# Patient Record
Sex: Female | Born: 1955 | Race: White | Hispanic: No | Smoking: Never smoker
Health system: Southern US, Academic
[De-identification: ages and names within clinical notes are randomized; demographics above are authoritative.]

## PROBLEM LIST (undated history)

## (undated) DIAGNOSIS — K589 Irritable bowel syndrome without diarrhea: Secondary | ICD-10-CM

## (undated) DIAGNOSIS — R609 Edema, unspecified: Secondary | ICD-10-CM

## (undated) DIAGNOSIS — I10 Essential (primary) hypertension: Secondary | ICD-10-CM

## (undated) DIAGNOSIS — R002 Palpitations: Secondary | ICD-10-CM

## (undated) DIAGNOSIS — K219 Gastro-esophageal reflux disease without esophagitis: Secondary | ICD-10-CM

## (undated) DIAGNOSIS — R0600 Dyspnea, unspecified: Secondary | ICD-10-CM

## (undated) DIAGNOSIS — I493 Ventricular premature depolarization: Secondary | ICD-10-CM

## (undated) DIAGNOSIS — J45909 Unspecified asthma, uncomplicated: Secondary | ICD-10-CM

## (undated) DIAGNOSIS — Z85828 Personal history of other malignant neoplasm of skin: Secondary | ICD-10-CM

## (undated) HISTORY — PX: BASAL CELL CARCINOMA EXCISION: SHX1214

## (undated) HISTORY — PX: HX APPENDECTOMY: SHX54

## (undated) HISTORY — DX: Irritable bowel syndrome, unspecified: K58.9

## (undated) HISTORY — DX: Essential (primary) hypertension: I10

## (undated) HISTORY — DX: Gastro-esophageal reflux disease without esophagitis: K21.9

## (undated) HISTORY — DX: Edema, unspecified: R60.9

## (undated) HISTORY — DX: Personal history of other malignant neoplasm of skin: Z85.828

## (undated) HISTORY — PX: HX COLONOSCOPY: 2100001147

## (undated) HISTORY — DX: Dyspnea, unspecified: R06.00

## (undated) HISTORY — DX: Ventricular premature depolarization: I49.3

## (undated) HISTORY — DX: Unspecified asthma, uncomplicated: J45.909

## (undated) HISTORY — PX: HX GALL BLADDER SURGERY/CHOLE: SHX55

## (undated) HISTORY — DX: Palpitations: R00.2

---

## 1993-05-25 ENCOUNTER — Other Ambulatory Visit (HOSPITAL_COMMUNITY): Payer: Self-pay | Admitting: EXTERNAL

## 2018-07-08 HISTORY — PX: CARDIAC CATHETERIZATION: SHX172

## 2020-10-06 IMAGING — CR XRAY THORACIC SPINE 2 VIEWS
1 series · 3 of 3 positions shown · non-contrast
Comparison: None previous.

﻿EXAM:  [DATE]      XRAY THORACIC SPINE 2 VIEWS,XRAY LUMBAR SPINE MINIMUM 4 VIEWS
INDICATION: 64-year-old with upper and low back pain.  No history of trauma.

[Series 1: view not recorded · 0.17mm/px · 3 of 3 slices shown]
[im 1/3]
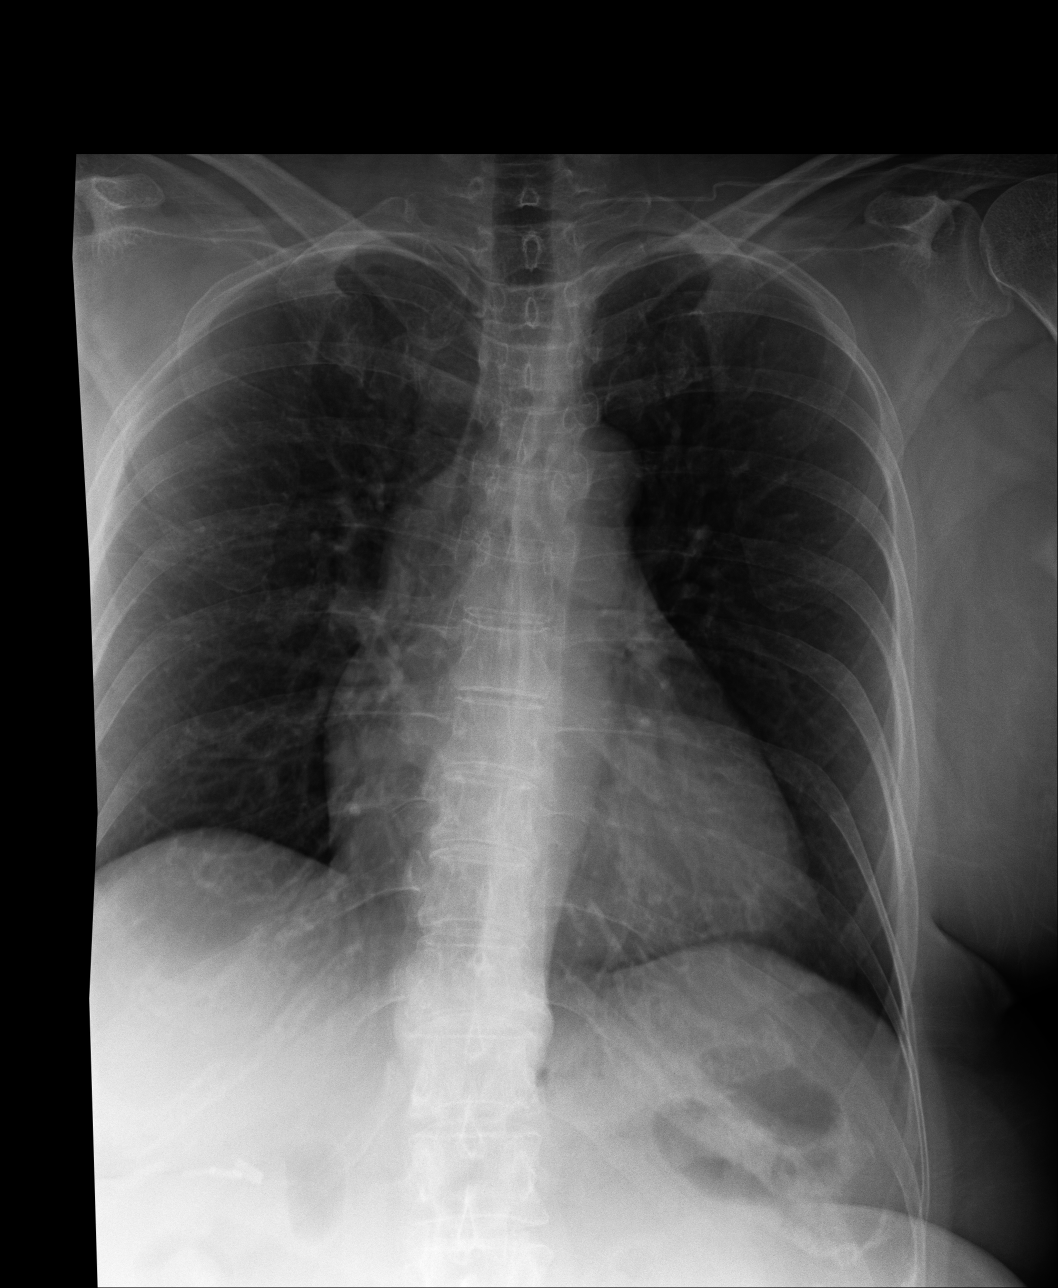
[im 2/3]
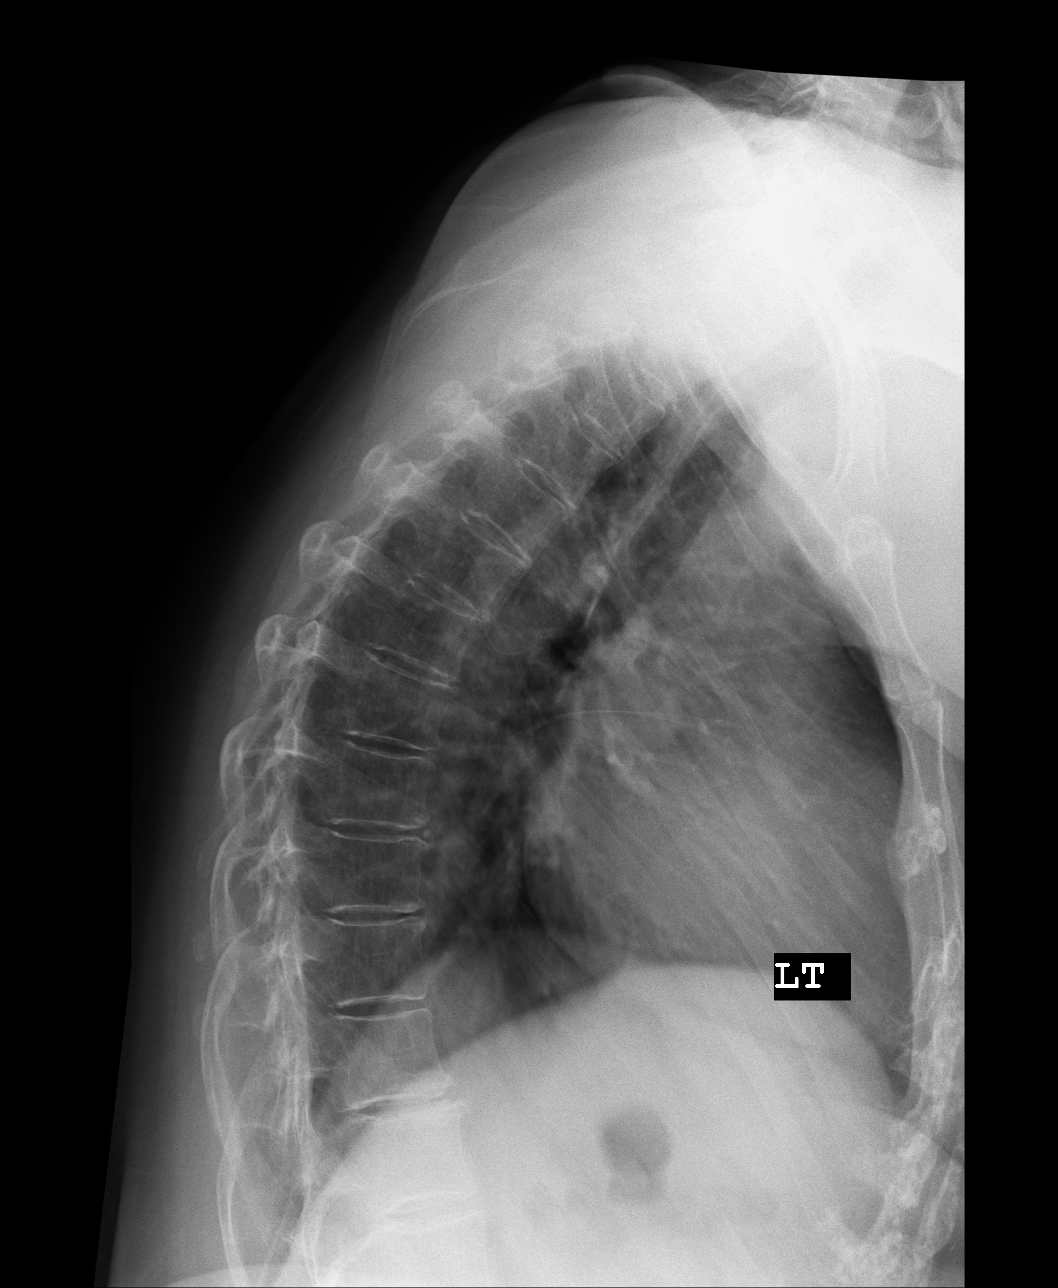
[im 3/3]
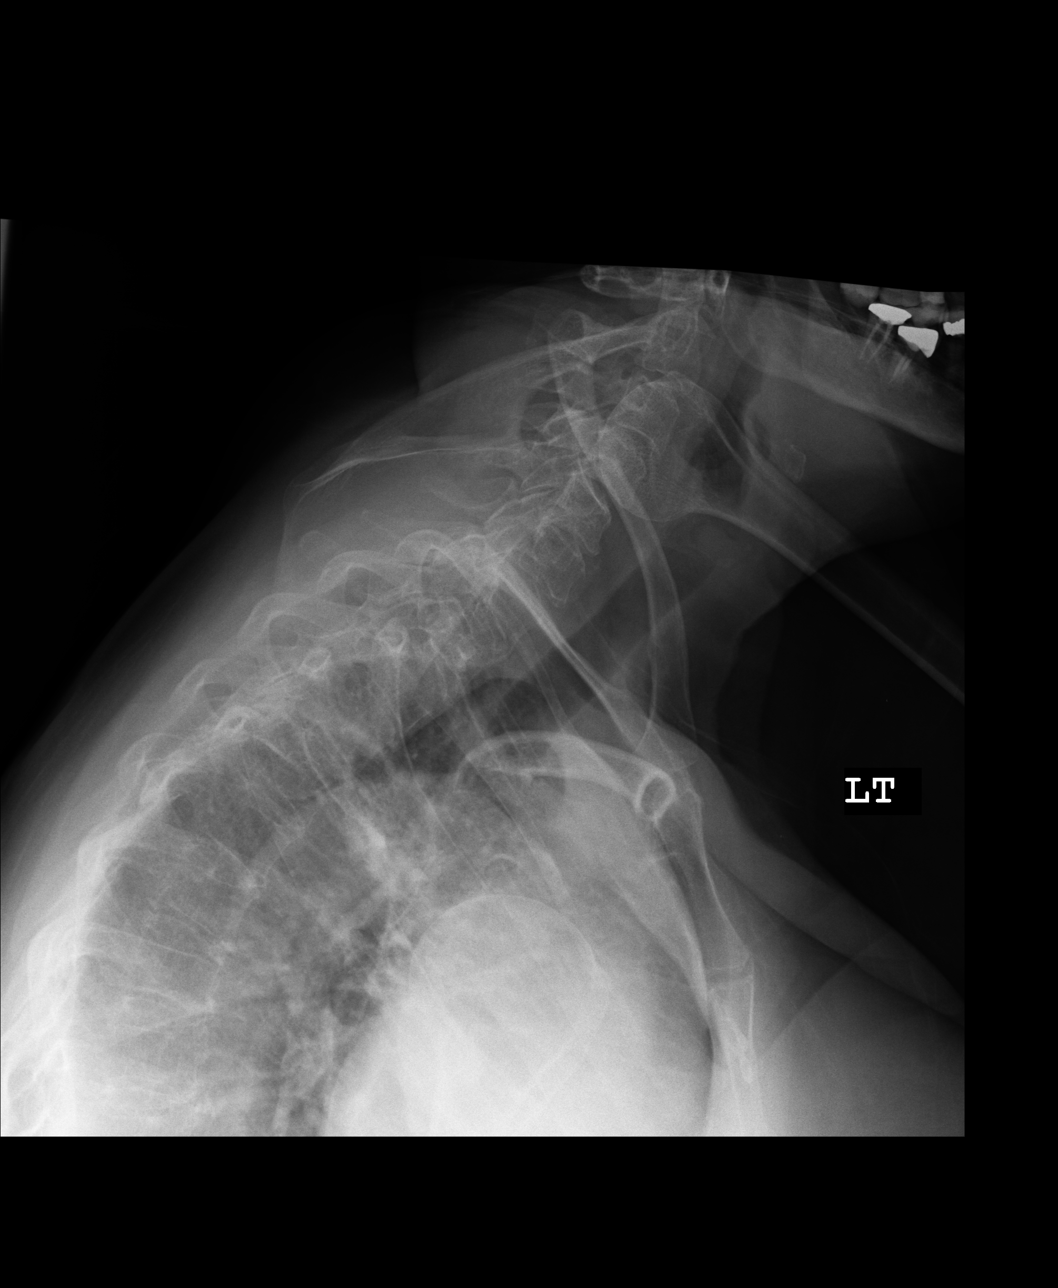

[3 of 3 positions shown; findings below may reference images not displayed]

FINDINGS: Osteopenic bones are suspected.  Severe degenerative disc changes and facet arthropathy are noted at L5-S1 level with lesser changes in the mid lumbar spine. 

Significant degenerative disc changes in the lower thoracic spine at T11-T12 level noted.  Paravertebral soft tissues are unremarkable
IMPRESSION: 1. Suspected osteopenic bones.  Please correlate with DEXA densitometry.

2. Severe degenerative disc changes and facet arthropathy at L5-S1 level.  Bilateral sacroiliac arthropathy. 

3. Significant degenerative disc disease at T11-T12 level.  

4. If symptoms are persistent, further imaging with MRI is recommended.

## 2020-10-06 IMAGING — CR XRAY LUMBAR SPINE MINIMUM 4 VIEWS
1 series · 5 of 5 positions shown · non-contrast
Comparison: None previous.

﻿EXAM:  [DATE]      XRAY THORACIC SPINE 2 VIEWS,XRAY LUMBAR SPINE MINIMUM 4 VIEWS
INDICATION: 64-year-old with upper and low back pain.  No history of trauma.

[Series 1: view not recorded · 0.17mm/px · 5 of 5 slices shown]
[im 1/5]
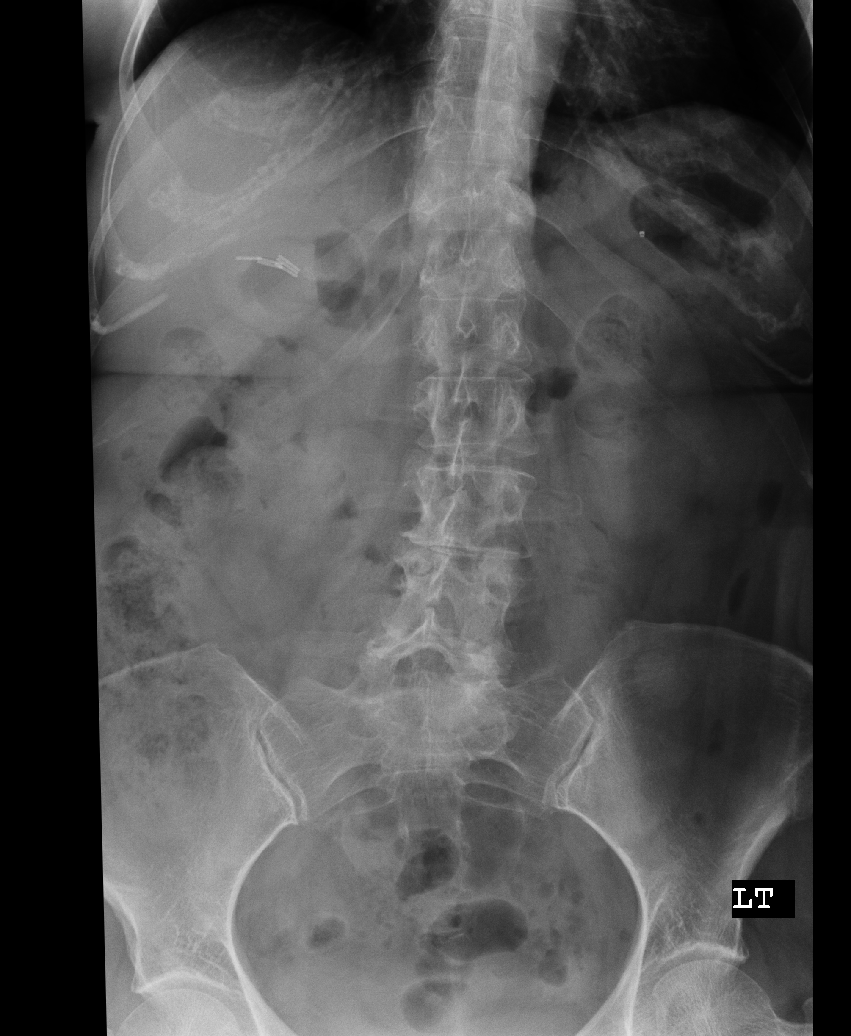
[im 2/5]
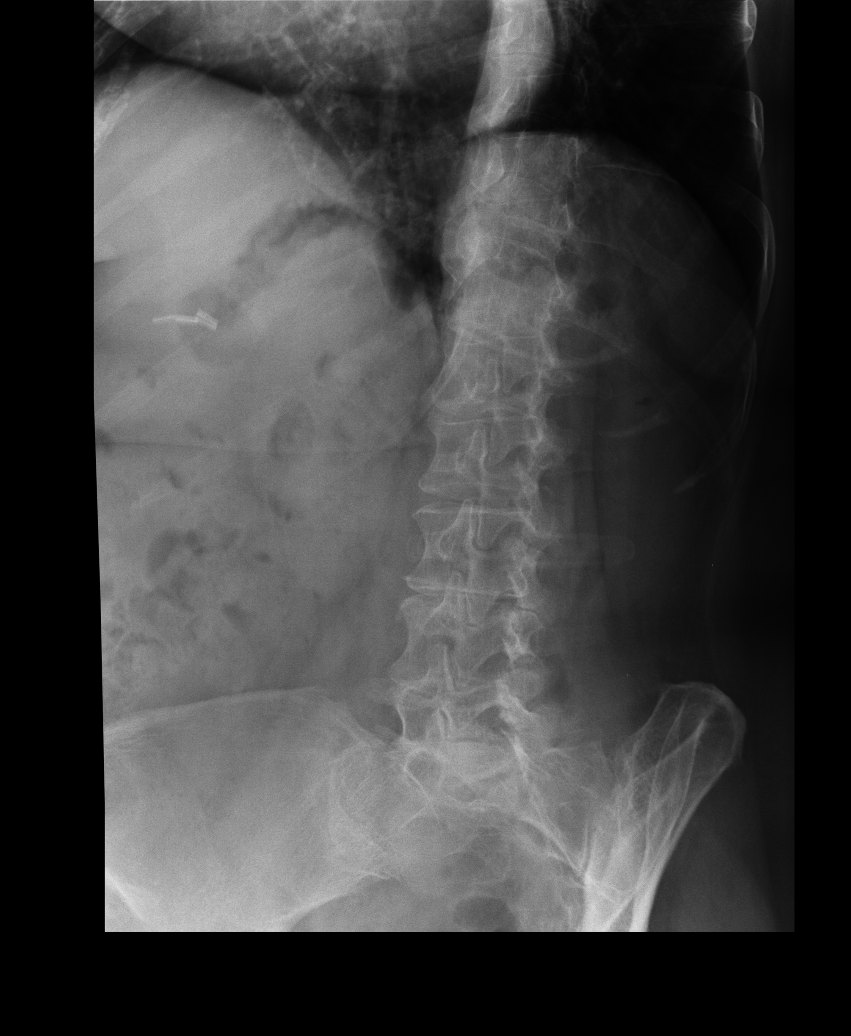
[im 3/5]
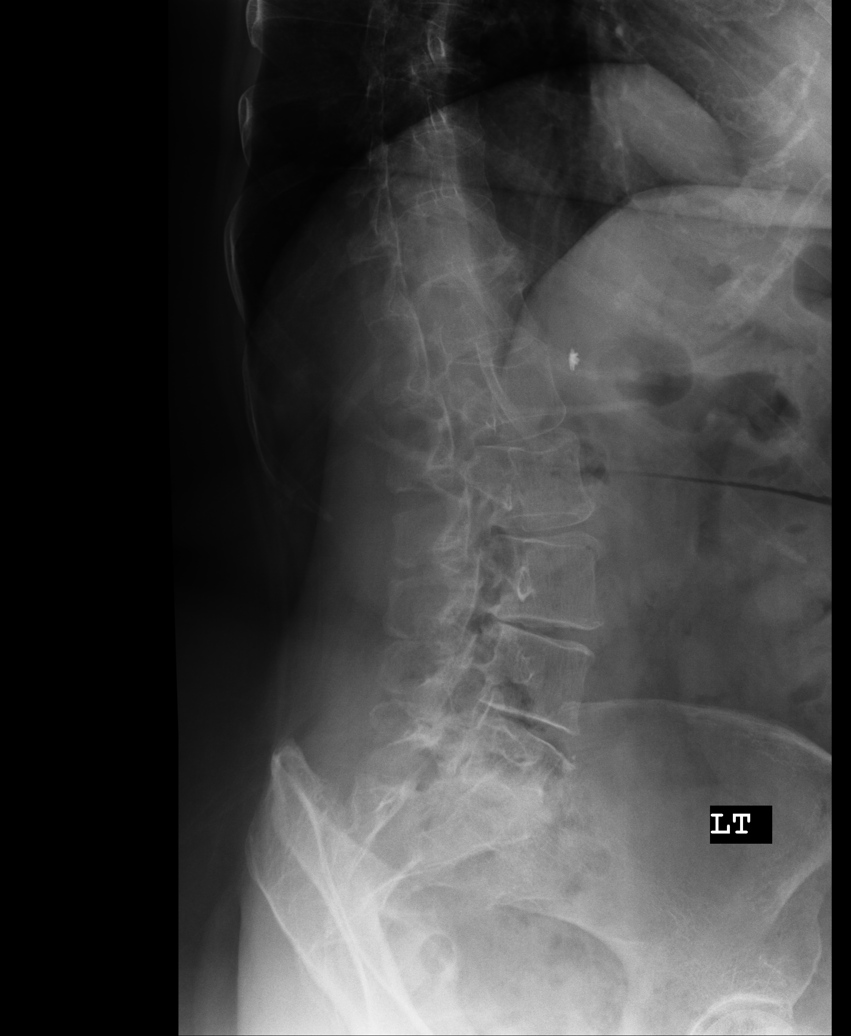
[im 4/5]
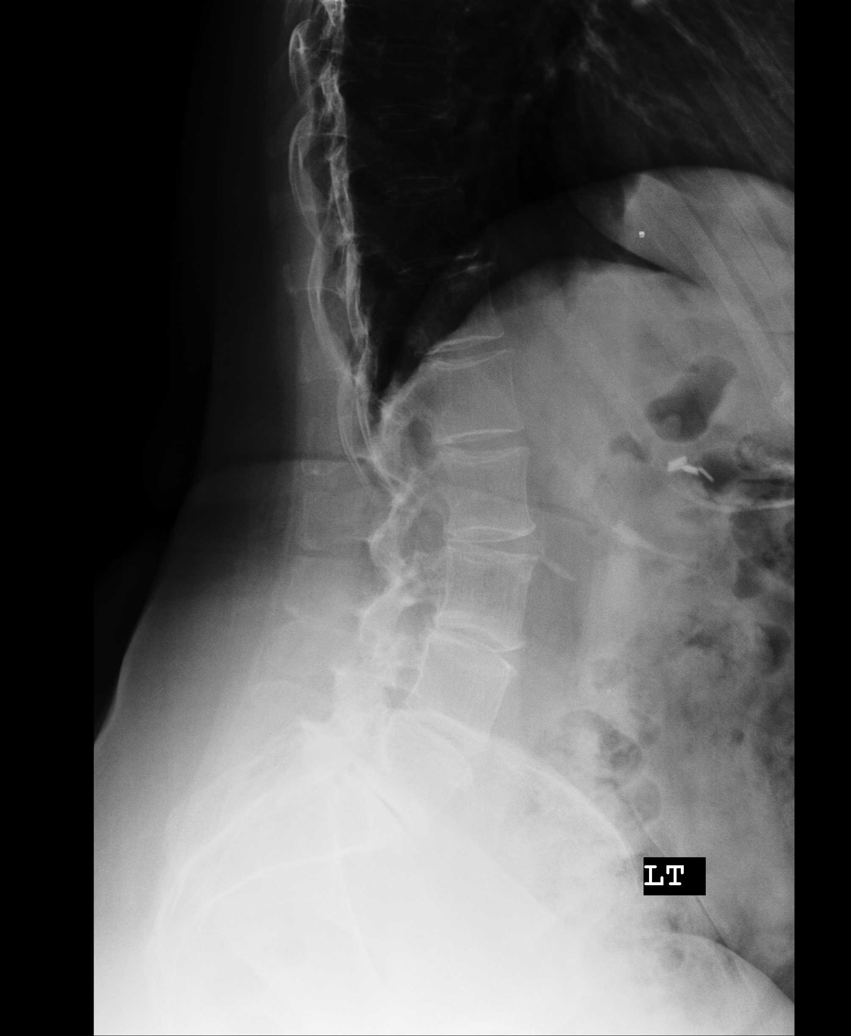
[im 5/5]
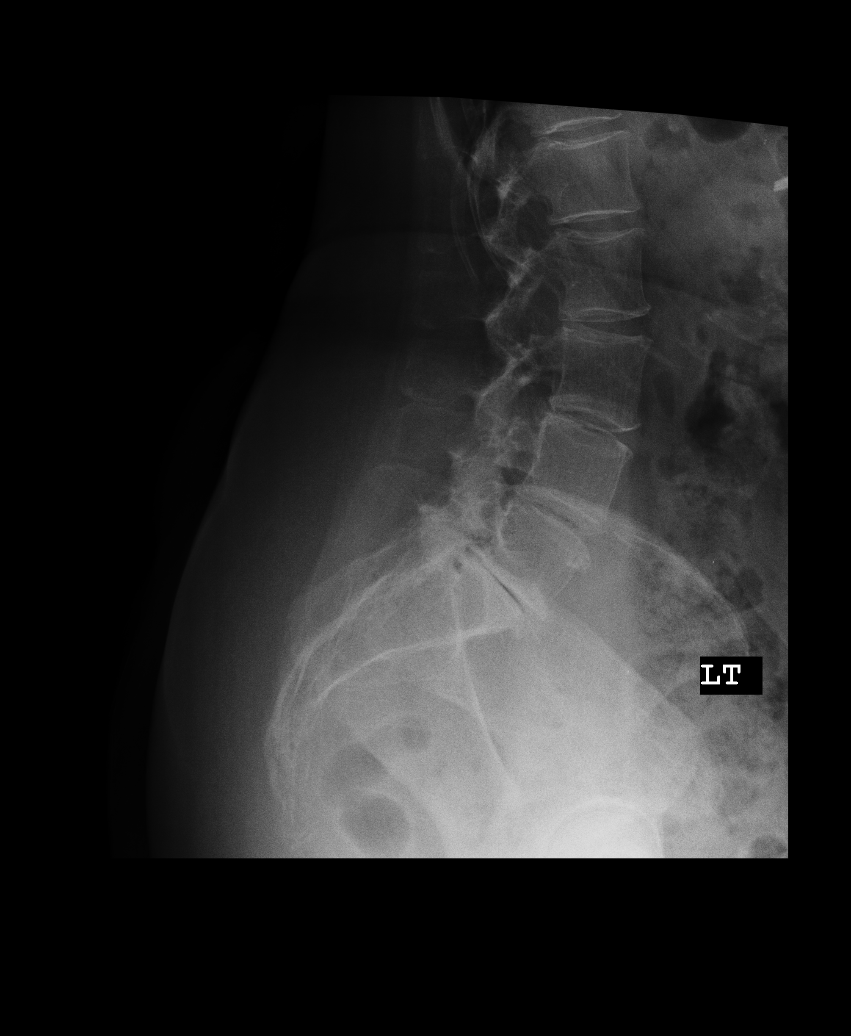

[5 of 5 positions shown; findings below may reference images not displayed]

FINDINGS: Osteopenic bones are suspected.  Severe degenerative disc changes and facet arthropathy are noted at L5-S1 level with lesser changes in the mid lumbar spine. 

Significant degenerative disc changes in the lower thoracic spine at T11-T12 level noted.  Paravertebral soft tissues are unremarkable
IMPRESSION: 1. Suspected osteopenic bones.  Please correlate with DEXA densitometry.

2. Severe degenerative disc changes and facet arthropathy at L5-S1 level.  Bilateral sacroiliac arthropathy. 

3. Significant degenerative disc disease at T11-T12 level.  

4. If symptoms are persistent, further imaging with MRI is recommended.

## 2021-04-15 IMAGING — MG 3D SCREENING MAMMO BIL W/CAD & TOMO
5 series · 8 of 24 positions shown · non-contrast
Comparison: None.  This is a baseline exam.

------------- REPORT [id number] -------------
﻿

EXAM:  3D BILATERAL ANNUAL SCREENING DIGITAL MAMMOGRAM WITH CAD AND TOMOSYNTHESIS
INDICATION: Screening.

[L]
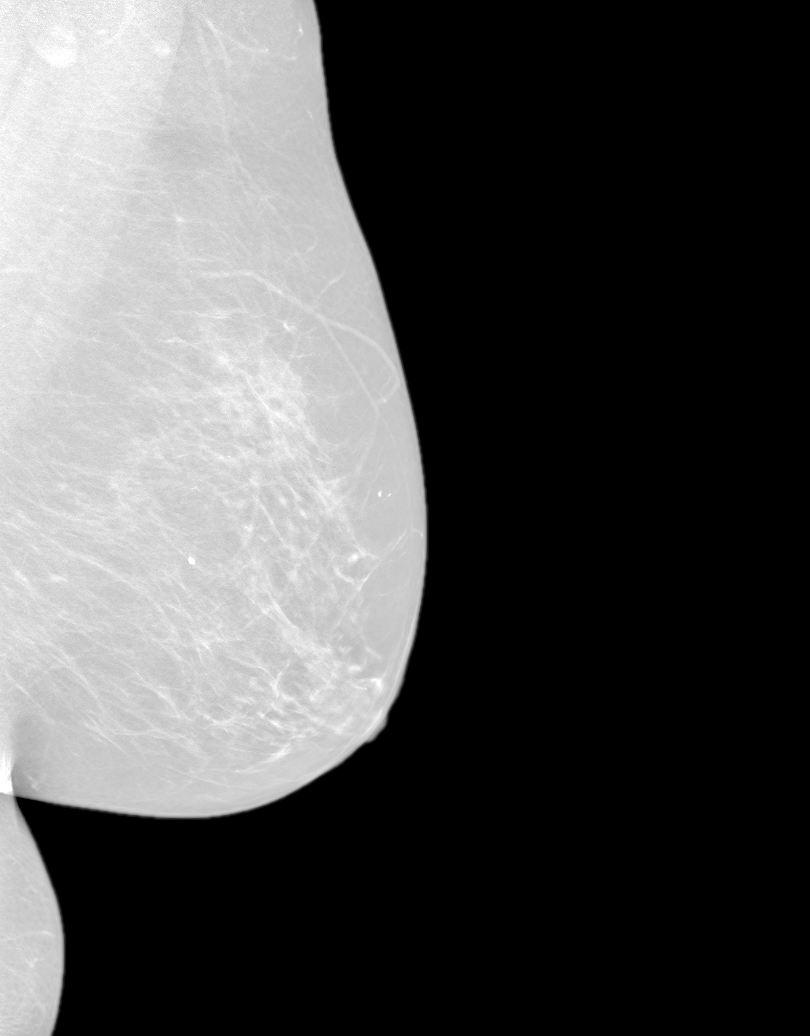

[R]
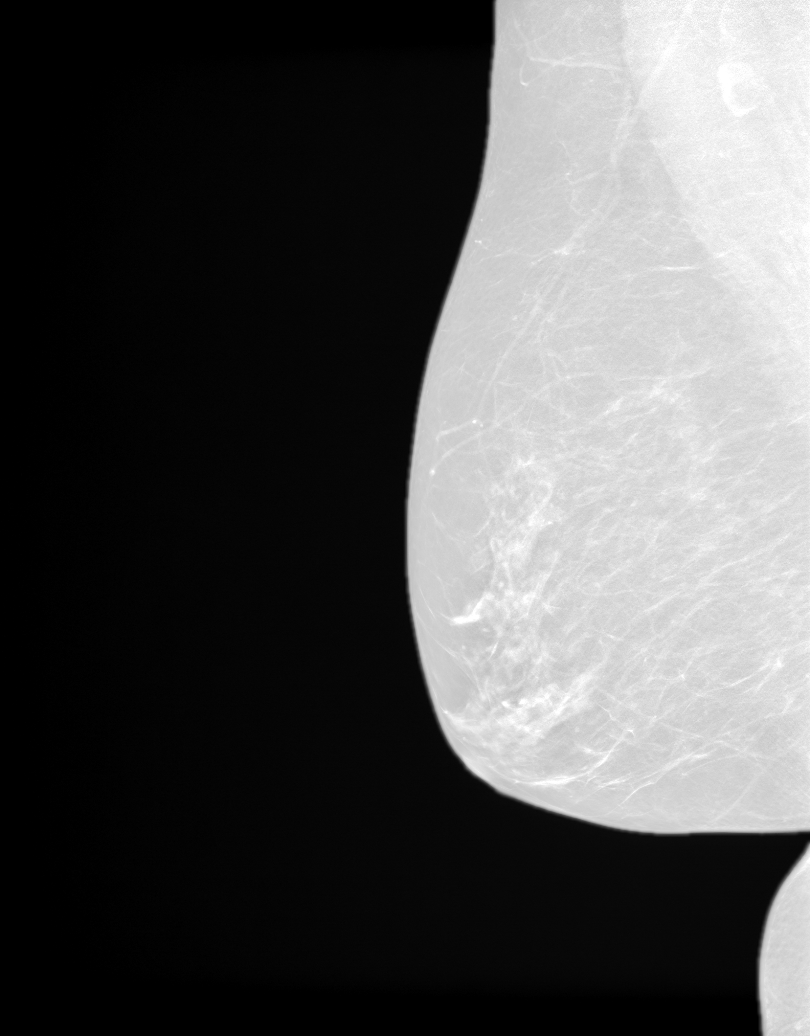

[R CC tomo · right · 0.10mm/px · 2 of 2 slices shown]
[im 1/2]
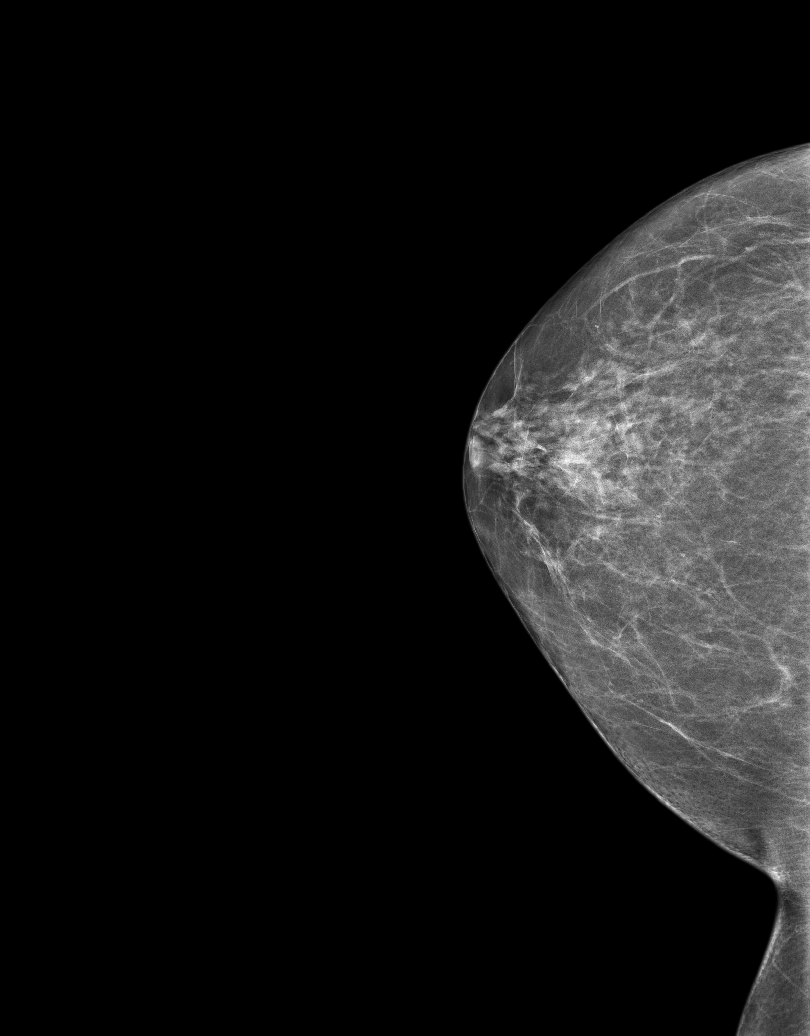
[im 2/2]
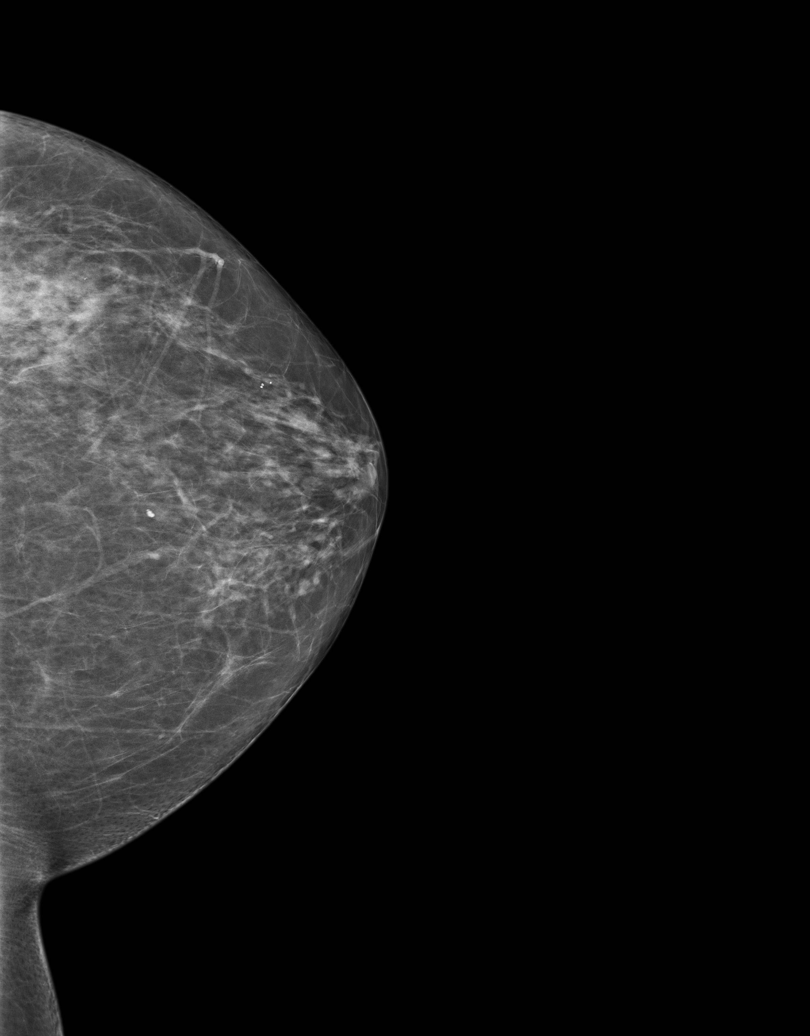

[3D SCREENING MAMMO BIL W/CAD & TOMO tomo · 2 acquisitions, 3 frames shown (1 of 2)]
[im 1/2]
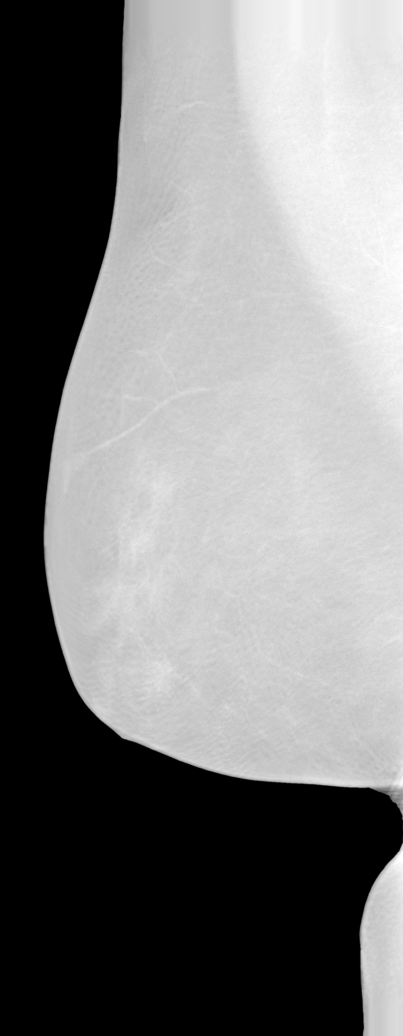
[im 2/2]
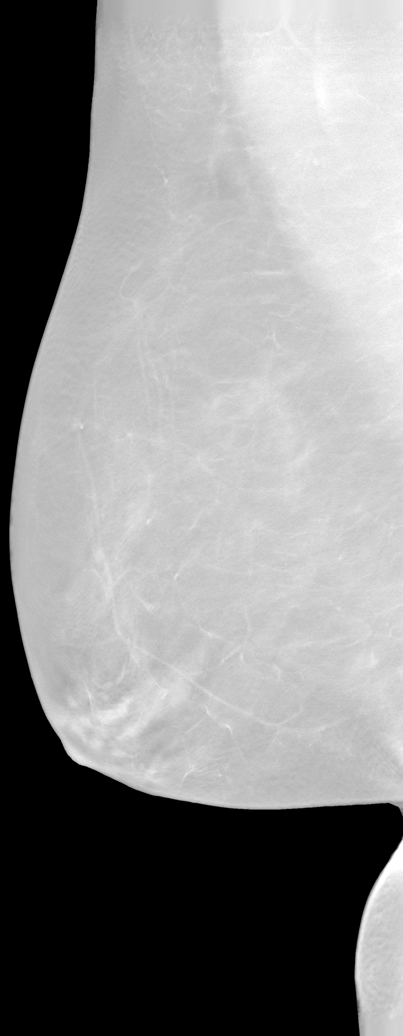
[im 2/2]
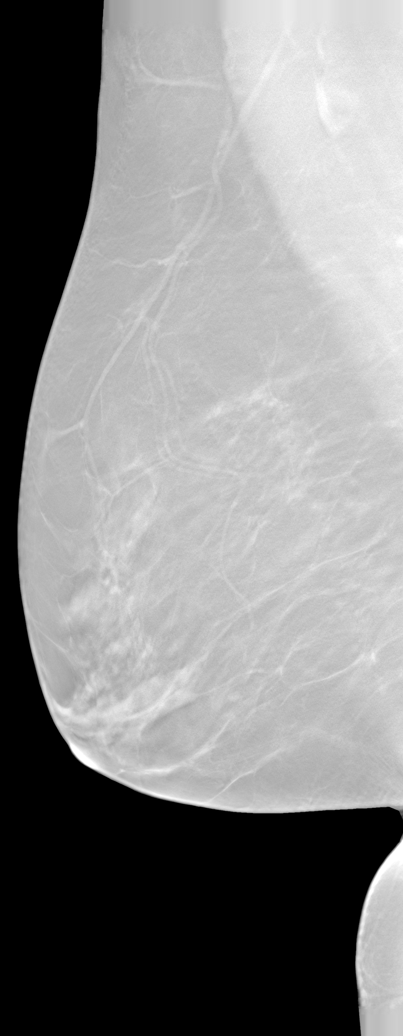

[3D SCREENING MAMMO BIL W/CAD & TOMO tomo (2 of 2) · tomo slice 13/83.0]
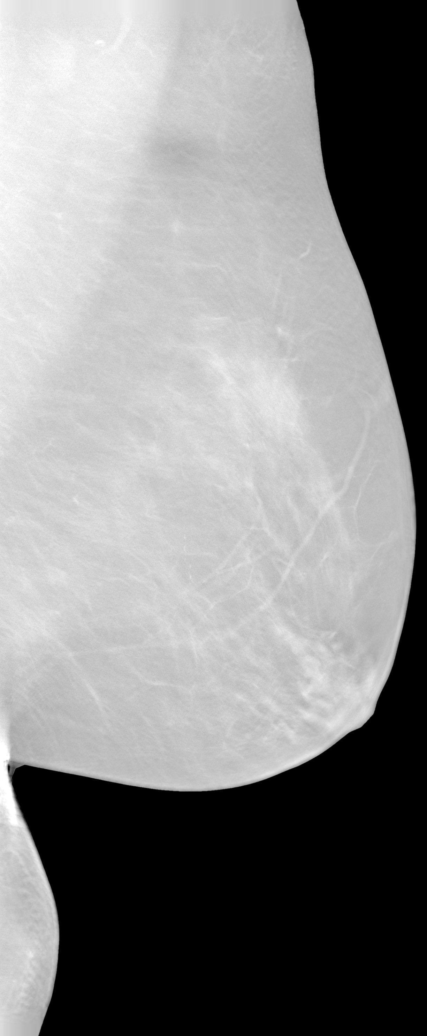

[8 of 24 positions shown; findings below may reference images not displayed]

FINDINGS: Breast parenchyma is heterogeneously dense.  There is no mass or suspicious cluster of microcalcifications.  There is no architectural distortion, skin thickening or nipple retraction.
IMPRESSION: 1.  BIRADS 0-Need additional imaging evaluation.  A comprehensive bilateral breast ultrasound is recommended due to heterogeneously dense breast parenchyma and lack of previous comparison mammograms.

2.  DENSITY CODE – C (Heterogeneously dense). 

Final Assessment Code:

Bi-Rads 0

BI-RADS 0
 Need additional imaging evaluation.

BI-RADS 1
 Negative mammogram.

BI-RADS 2
 Benign finding.

BI-RADS 3
 Probably benign finding; short-interval follow-up suggested.

BI-RADS 4
 Suspicious abnormality; biopsy should be considered.

BI-RADS 5
 Highly suggestive of malignancy; appropriate action should be taken.

BI-RADS 6
 Known biopsy-proven malignancy; appropriate action should be taken.

NOTE:
In compliance with Federal regulations, the results of this mammogram are being sent to the patient.

------------- REPORT [id number] -------------
[facility]
[address]
[place] Ms.TEII, TEIURUURU:
We wish to report the following on your recent mammography examination. We are sending a report to your referring physician or other health care provider. 
(       Abnormal: There is a finding on your mammogram that requires further tests for a more thorough evaluation. You should contact your referring physician as soon as possible (if you have not already done so).
This statement is mandated by the [place], Department of Health.
Your examination was performed by one of our technologists, who are registered radiological technologists and also specially certified in mammography:
___
Fatah, Abdalfatah (M)

Your mammogram was interpreted by our radiologist.

( 
Valentyna Groover, M.D.

(Annual Breast Examination by a physician or other health care provider
(Annual Mammography Screening beginning at age 40
(Monthly Breast Self Examination

## 2021-04-20 IMAGING — US US BIL BREAST [ID] QUADRANTS PLUS AXILLA
1 series · 14 of 25 positions shown · non-contrast
Comparison: Mammogram dated 04/15/2021.

﻿EXAM:  [id number]   US BIL BREAST [id number] QUADRANTS PLUS AXILLA
INDICATION: Call back for heterogeneously dense breast parenchyma.

[Series 1: us bil breast (id) quadrants plus axilla · 14 of 53 slices shown]
[im 1/53]
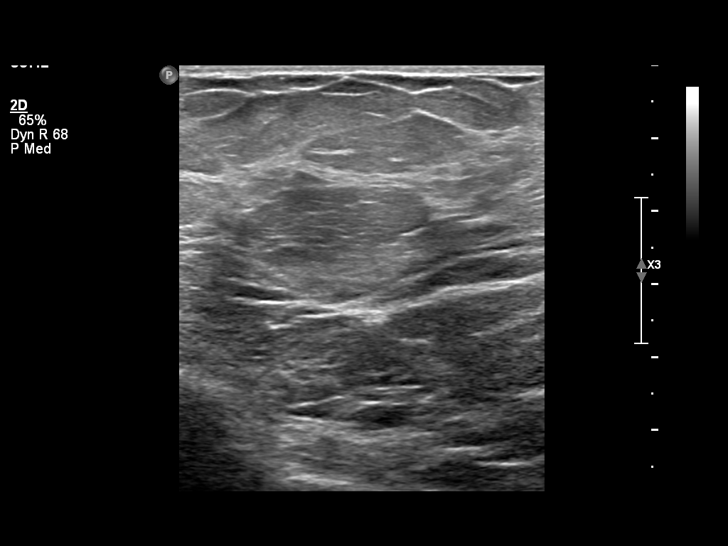
[im 5/53]
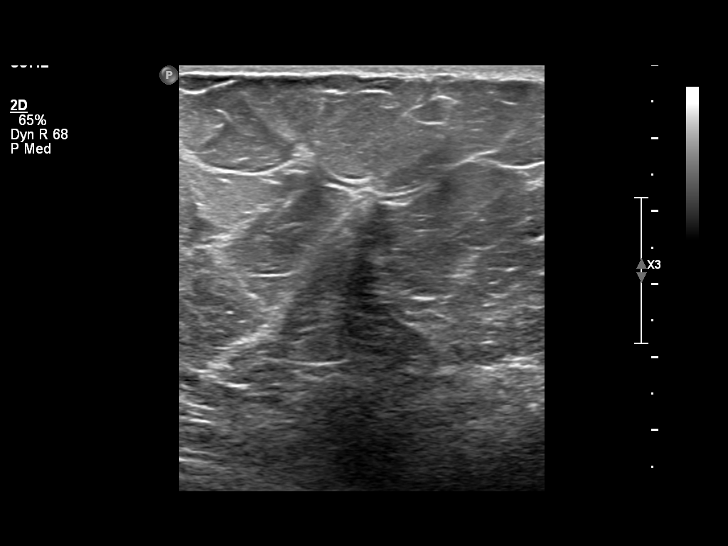
[im 9/53]
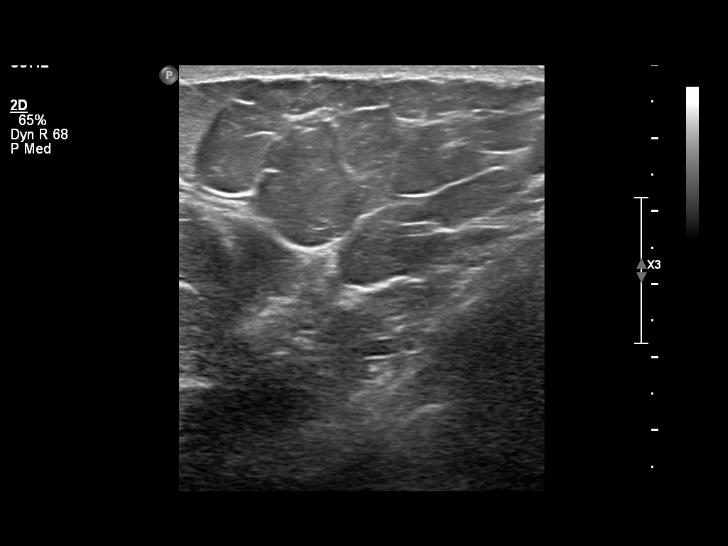
[im 14/53]
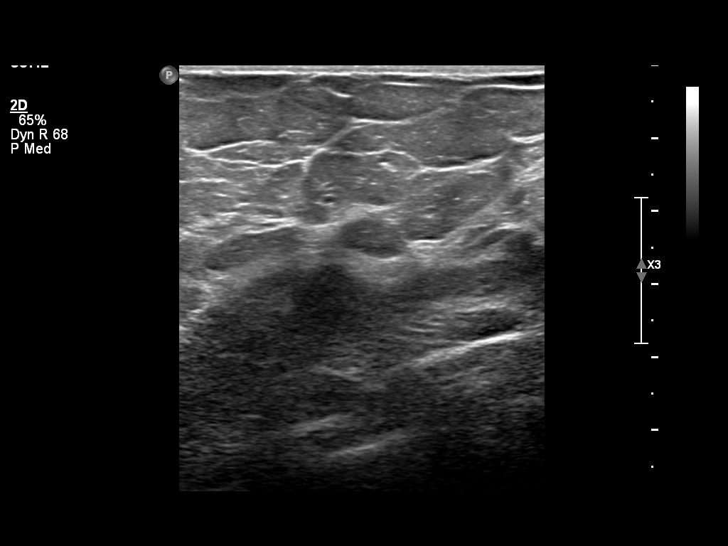
[im 18/53]
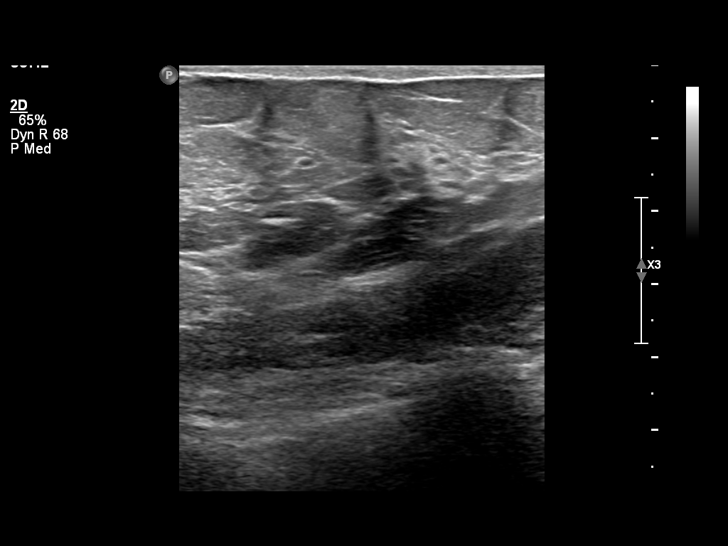
[im 20/53]
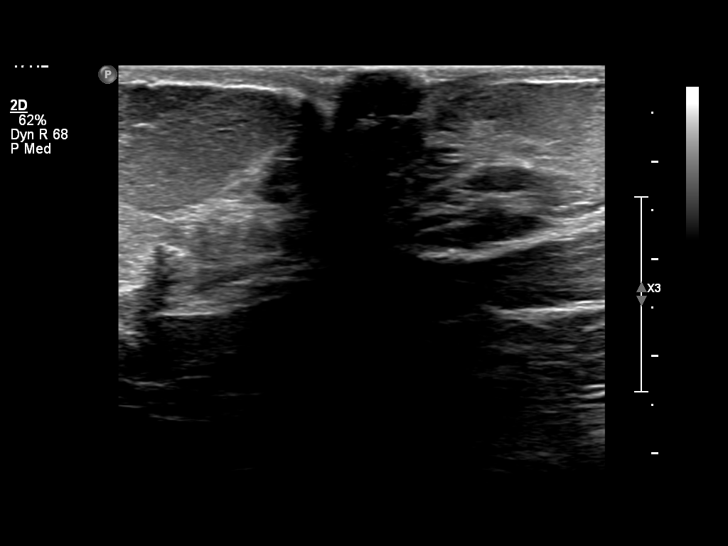
[im 24/53]
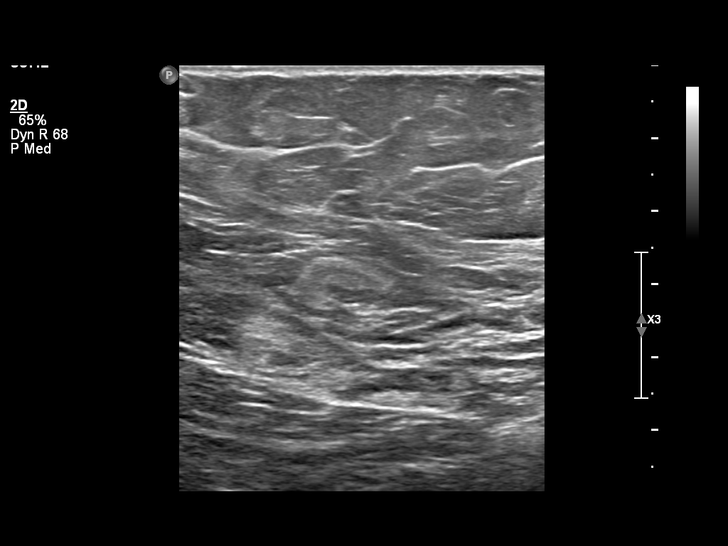
[im 29/53]
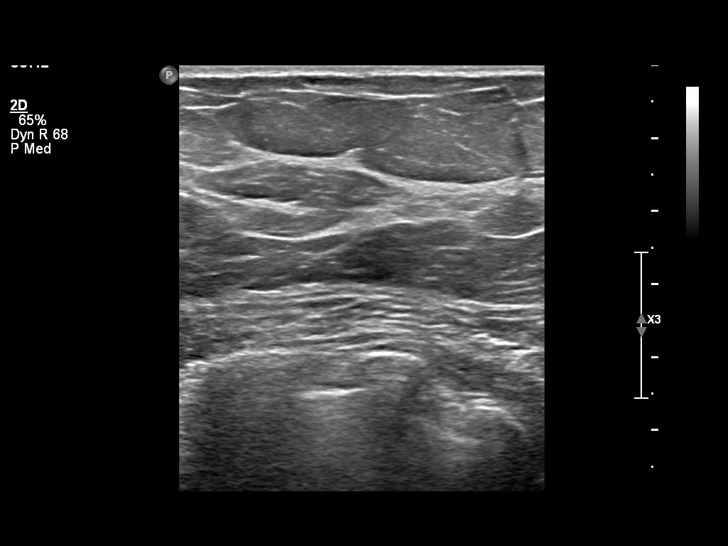
[im 33/53]
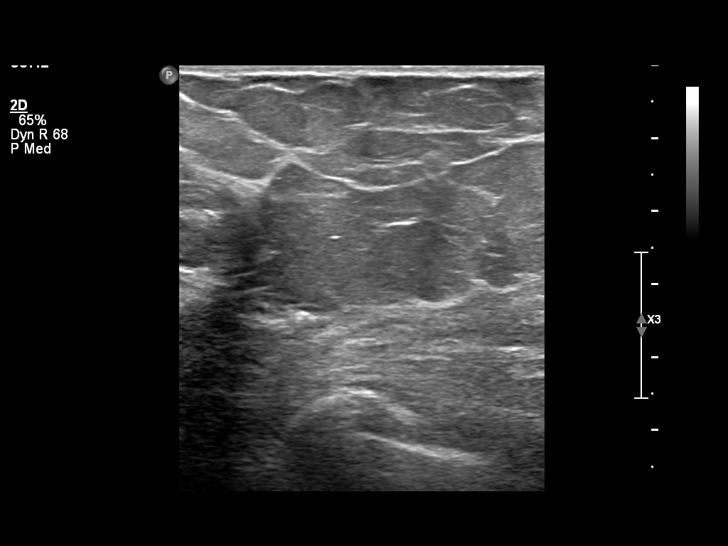
[im 35/53]
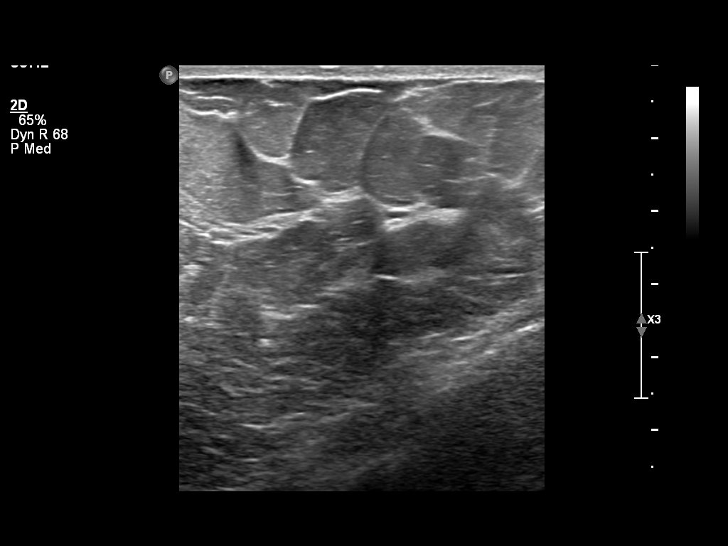
[im 40/53]
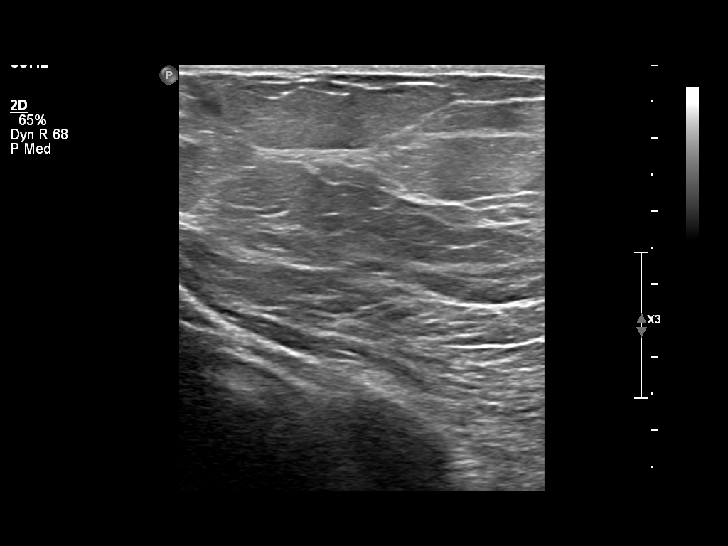
[im 44/53]
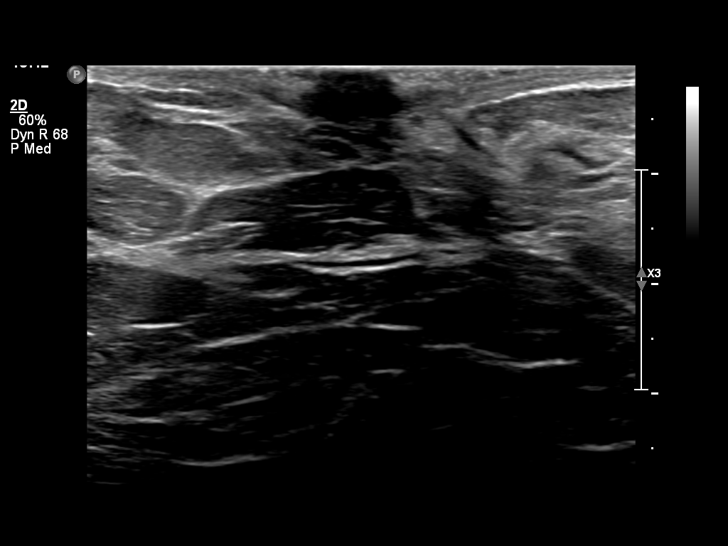
[im 48/53]
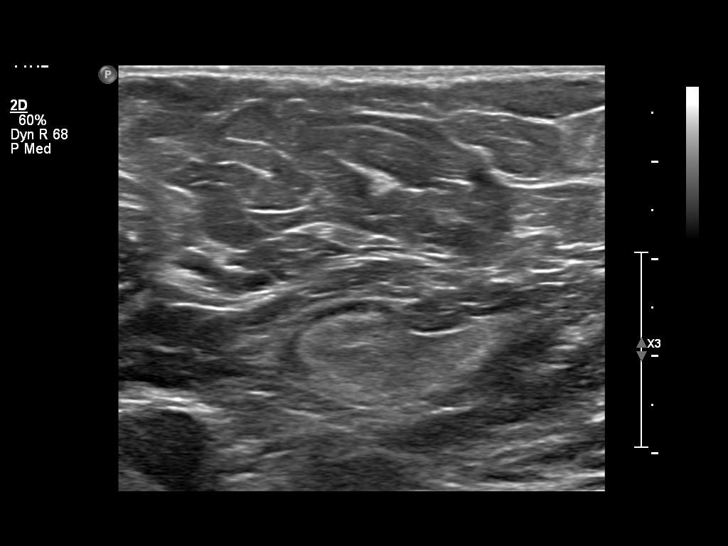
[im 53/53]
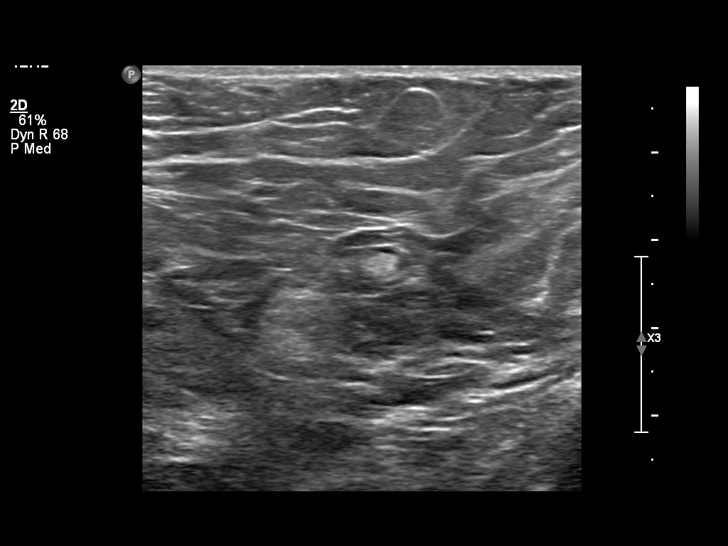

[14 of 25 positions shown; findings below may reference images not displayed]

FINDINGS: A comprehensive sonographic evaluation of both breasts was performed and representative images of all four quadrants, retroareolar and axillary regions were obtained.  There is no suspicious solid or cystic mass.  No axillary adenopathy is seen on either side.
IMPRESSION: BIRDS 2-Benign findings.  Patient has been added in a reminder system with a target date for the next screening mammography.

## 2021-06-05 IMAGING — MR MRI LUMBAR SPINE WITHOUT CONTRAST
8 series · 48 of 48 positions shown · IV contrast (gadolinium)
Comparison: Radiographs dated 10/06/2020.

﻿EXAM:  [id number]   MRI LUMBAR SPINE WITHOUT CONTRAST
INDICATION: Chronic lower back pain.
TECHNIQUE: Multiplanar multisequential MRI of the lumbar spine was performed without gadolinium contrast.

[Series 7: s-map · sagittal · 10.6mm · 5.31mm/px · 21 of 88 slices shown]
[im 1/88]
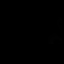
[im 5/88]
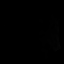
[im 9/88]
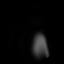
[im 14/88]
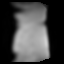
[im 18/88]
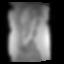
[im 22/88]
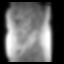
[im 27/88]
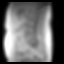
[im 31/88]
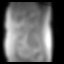
[im 35/88]
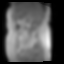
[im 40/88]
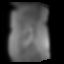
[im 44/88]
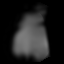
[im 48/88]
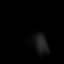
[im 53/88]
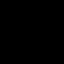
[im 57/88]
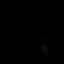
[im 61/88]
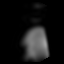
[im 66/88]
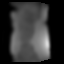
[im 70/88]
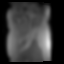
[im 74/88]
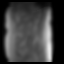
[im 79/88]
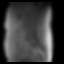
[im 83/88]
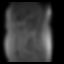
[im 88/88]
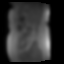

[Series 8: T2 · sagittal · 4.0mm · 0.94mm/px · 3 of 13 slices shown (1 of 4)]
[im 1/13]
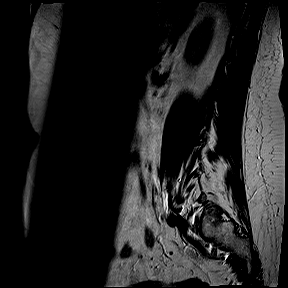
[im 7/13]
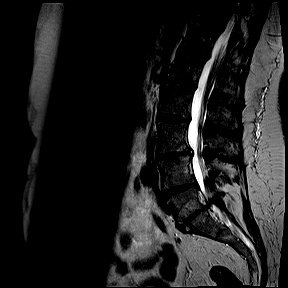
[im 13/13]
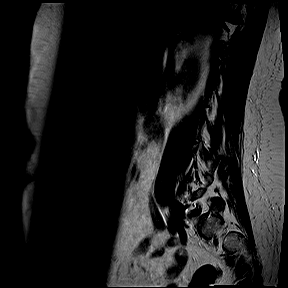

[Series 9: T1 · sagittal · 4.0mm · 0.94mm/px · 3 of 13 slices shown (1 of 2)]
[im 1/13]
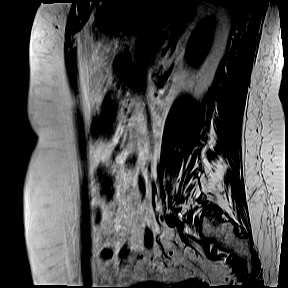
[im 7/13]
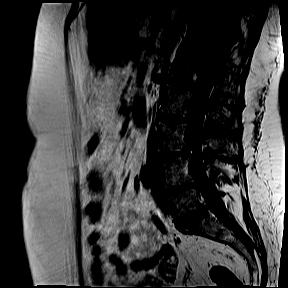
[im 13/13]
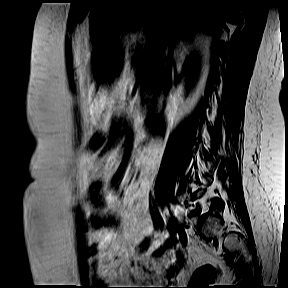

[Series 10: STIR · sagittal · 4.0mm · 1.05mm/px · 3 of 13 slices shown]
[im 1/13]
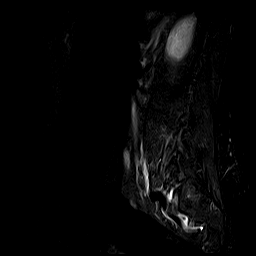
[im 7/13]
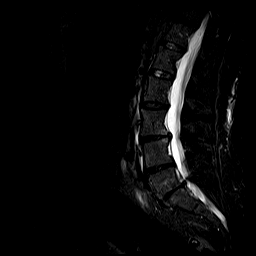
[im 13/13]
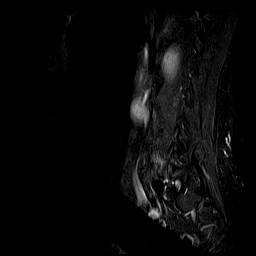

[Series 11: T2 · sagittal · 4.0mm · 0.94mm/px · 3 of 13 slices shown (2 of 4)]
[im 1/13]
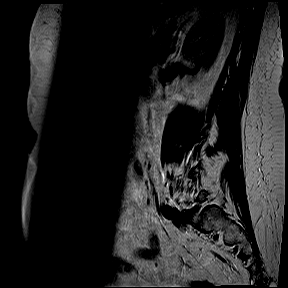
[im 7/13]
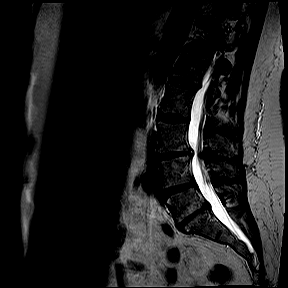
[im 13/13]
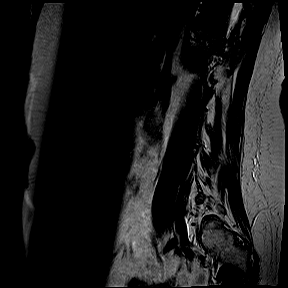

[Series 12: T2 · coronal · 4.0mm · 1.11mm/px · 5 of 20 slices shown (3 of 4)]
[im 1/20]
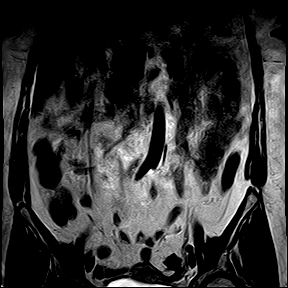
[im 5/20]
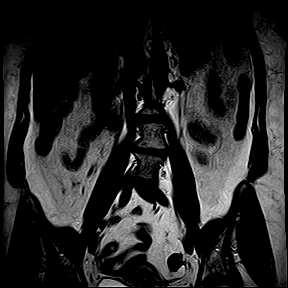
[im 10/20]
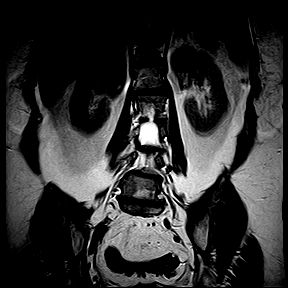
[im 15/20]
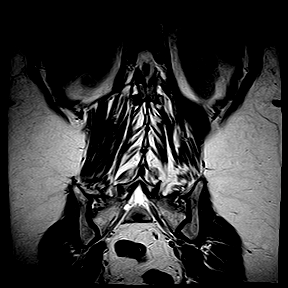
[im 20/20]
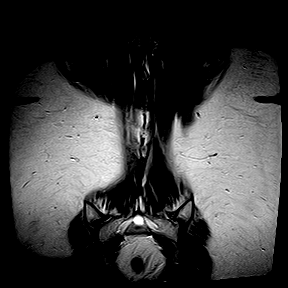

[Series 13: T2 · axial · 4.0mm · 0.47mm/px · z∈[-106,+72]mm · 5 of 23 slices shown (4 of 4)]
[im 1/23]
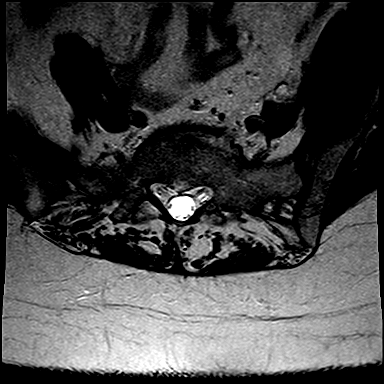
[im 6/23]
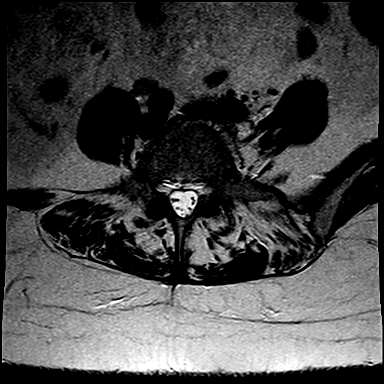
[im 12/23]
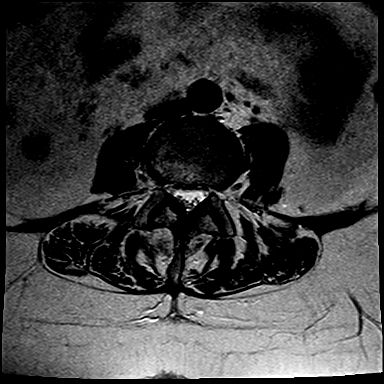
[im 17/23]
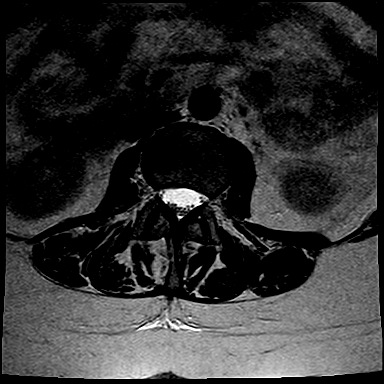
[im 23/23]
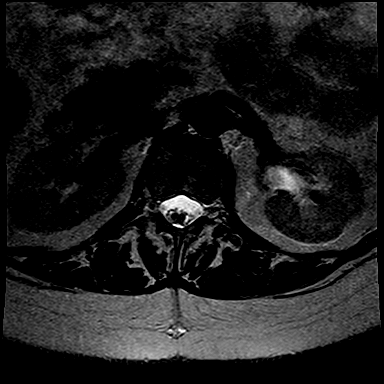

[Series 14: T1 · axial · 4.0mm · 0.47mm/px · z∈[-106,+72]mm · 5 of 23 slices shown (2 of 2)]
[im 1/23]
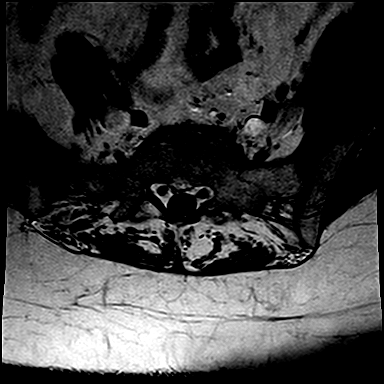
[im 6/23]
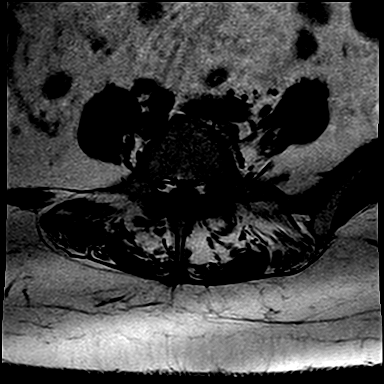
[im 12/23]
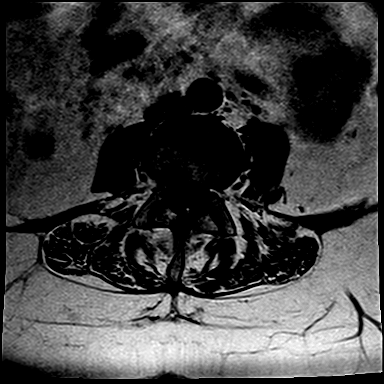
[im 17/23]
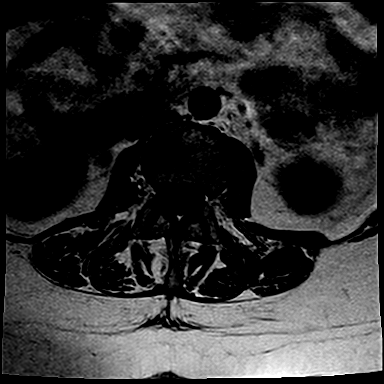
[im 23/23]
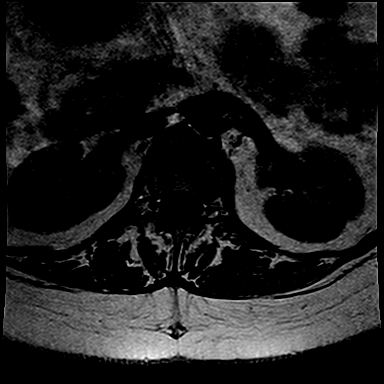

[48 of 48 positions shown; findings below may reference images not displayed]

FINDINGS: There is a mild levoscoliosis centered at L3 vertebral body level. Bone marrow signal intensity is normal. There is no acute fracture or subluxation. Distal spinal cord is normal in signal intensity and terminates normally at L1-2 disc space level. Spinal canal is congenitally narrow.

At L1-2 level, there is a small broad-based central disc bulge, mildly effacing the ventral thecal sac. There is no significant neural foraminal stenosis.

At L2-3 level, there is a minimal bulging annulus, minimally effacing the ventral thecal sac. There is mild right neural foraminal stenosis from facet arthropathy and bulging annulus.

At L3-4 level, there is moderate disc desiccation. There is a small broad-based central and right paracentral disc bulge resulting in mild-to-moderate spinal and severe right lateral recess compression. There is also moderate bilateral neural foraminal stenosis from facet arthropathy and bulging annulus.

At L4-5 level, there is mild-to-moderate bilateral neural foraminal stenosis from facet arthropathy and bulging annulus.

At L5-S1 level, there is moderate to marked disc desiccation. There is a small broad-based central disc bulge, mildly effacing the ventral thecal sac. There is severe left and mild-to-moderate right neural foraminal stenosis from facet arthropathy and bulging annulus.

Paraspinal soft tissues are unremarkable.
IMPRESSION: 1. Mild levoscoliosis centered at L3 vertebral body level. 

2. Mild-to-moderate spinal and severe right lateral recess compression at L3-4 level from a small central and right paracentral disc bulge. 

3. Multilevel neural foraminal stenosis as detailed above.

## 2021-06-05 IMAGING — MR MRI THORACIC SPINE WITHOUT CONTRAST
4 of 5 series · 26 of 48 positions shown · IV contrast (gadolinium)
Comparison: Thoracic spine radiographs dated 10/06/2020.

﻿EXAM:  [id number]   MRI THORACIC SPINE WITHOUT CONTRAST
INDICATION: Chronic back pain.
TECHNIQUE: Multiplanar multisequential MRI of the thoracic spine was performed without gadolinium contrast.

[Series 5: T2 · sagittal · 4.0mm · 0.78mm/px · 8 of 13 slices shown (1 of 2)]
[im 1/13]
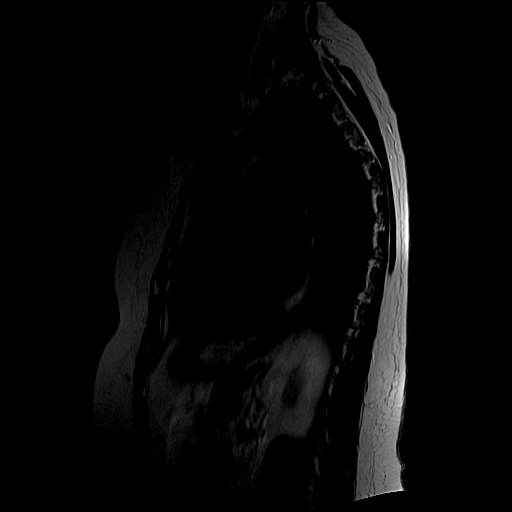
[im 2/13]
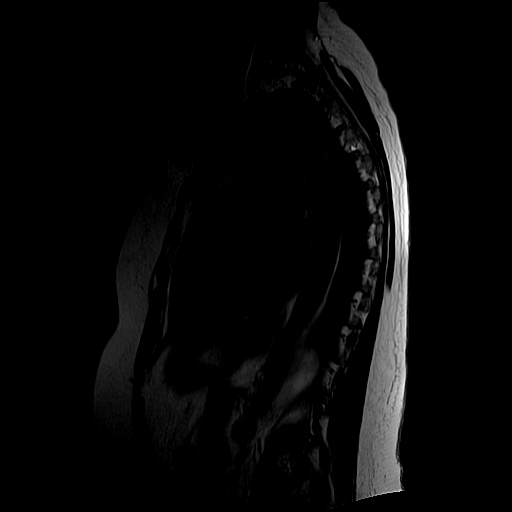
[im 4/13]
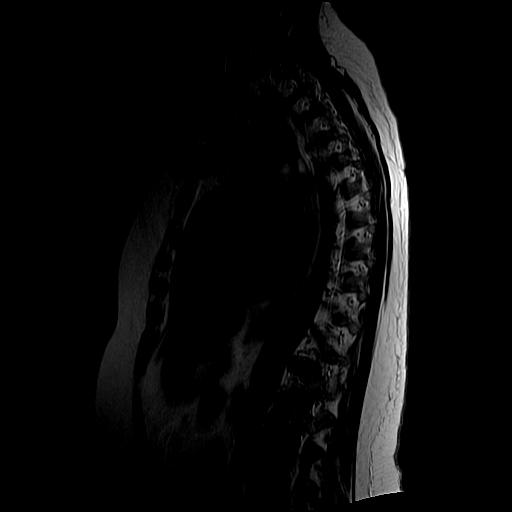
[im 6/13]
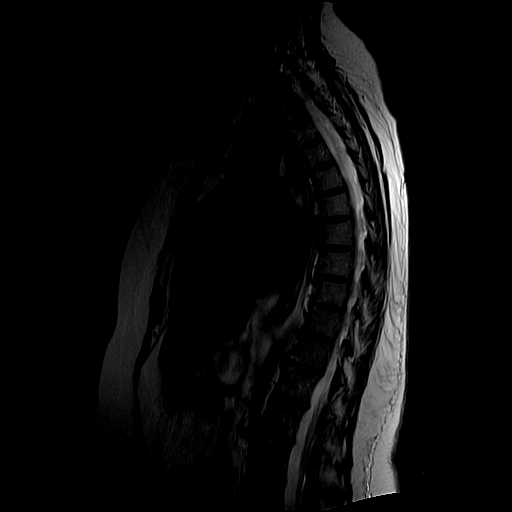
[im 7/13]
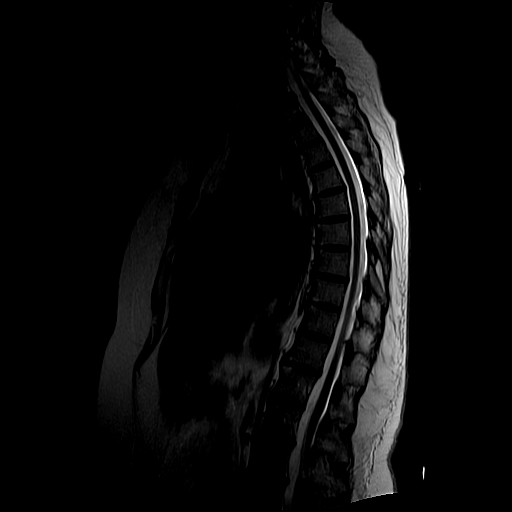
[im 9/13]
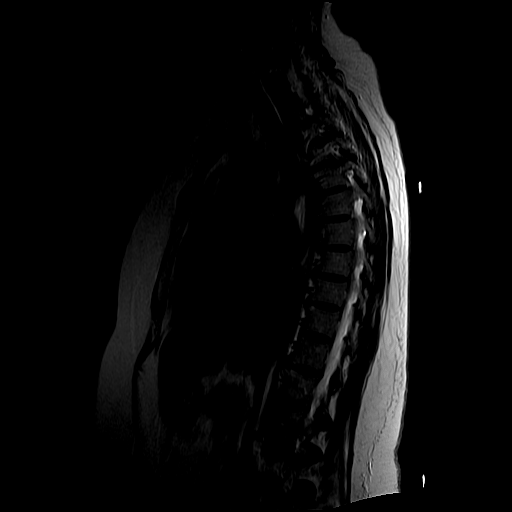
[im 11/13]
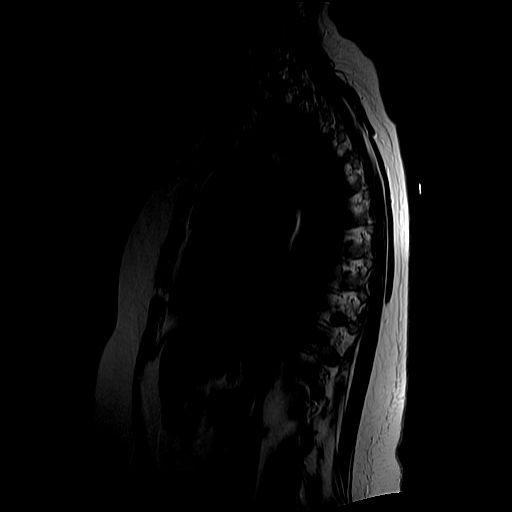
[im 13/13]
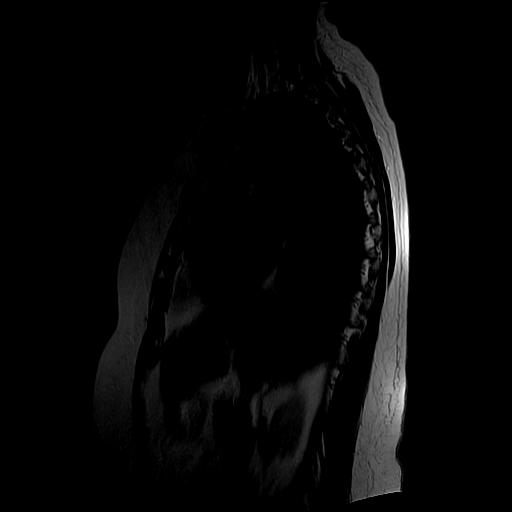

[Series 6: T1 · sagittal · 4.0mm · 0.78mm/px · 6 of 13 slices shown (1 of 2)]
[im 1/13]
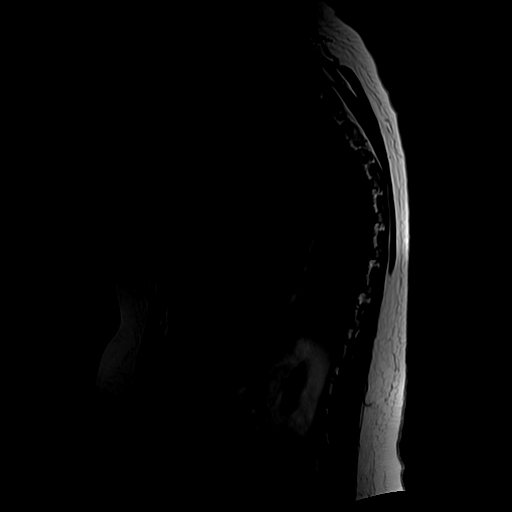
[im 2/13]
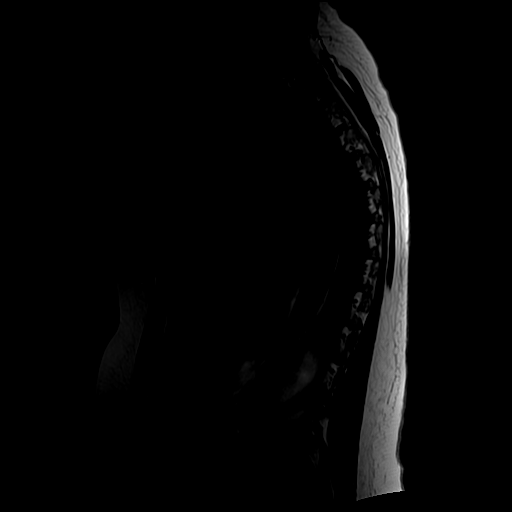
[im 4/13]
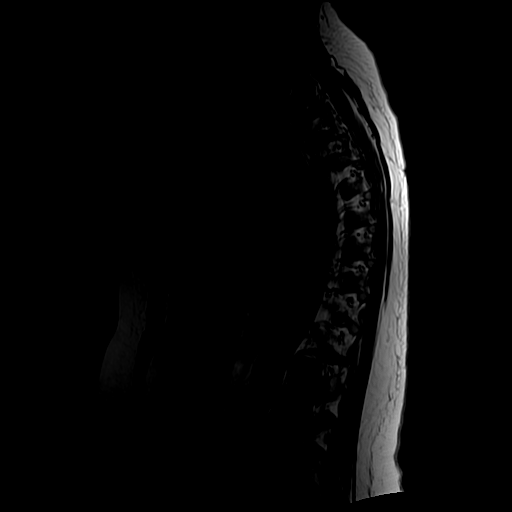
[im 6/13]
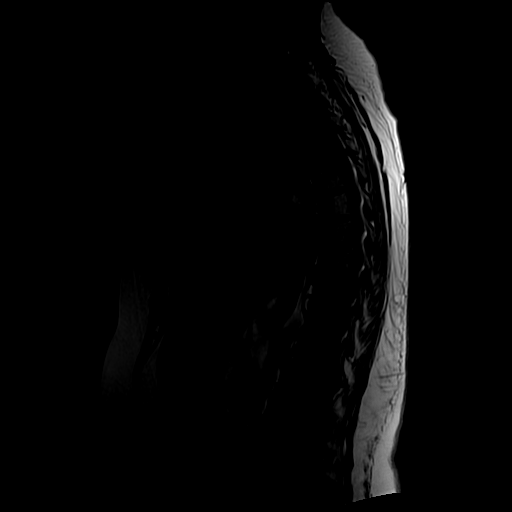
[im 7/13]
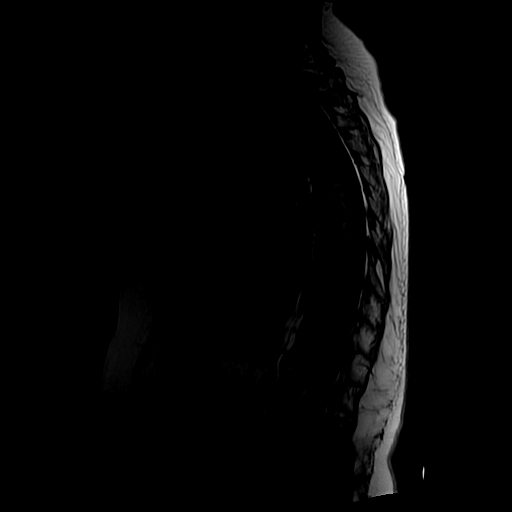
[im 11/13]
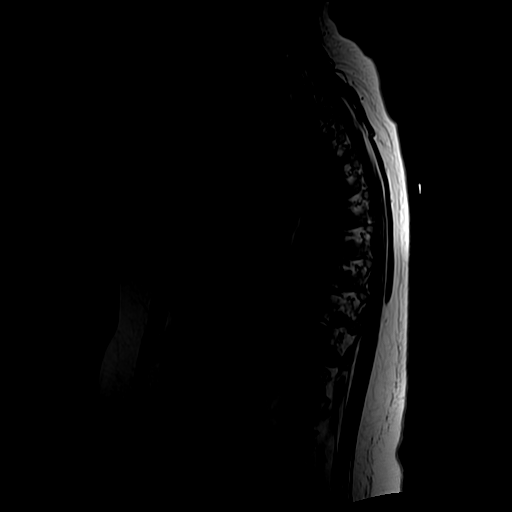

[Series 8: T2 · axial · 4.0mm · 0.62mm/px · z∈[-322,-141]mm · 9 of 18 slices shown (2 of 2)]
[im 1/18]
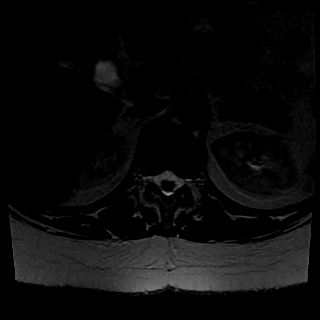
[im 4/18]
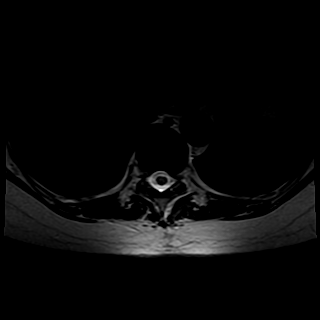
[im 5/18]
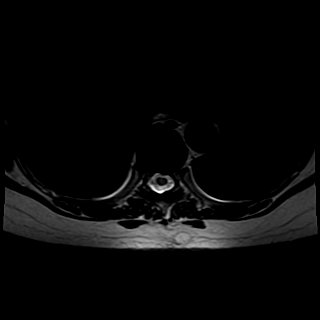
[im 8/18]
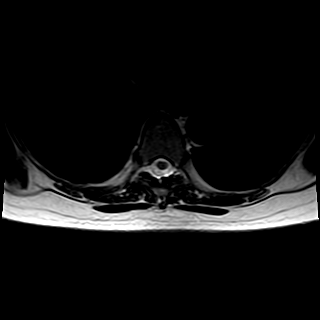
[im 10/18]
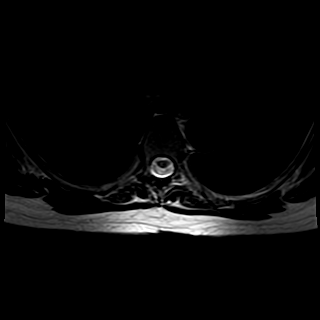
[im 13/18]
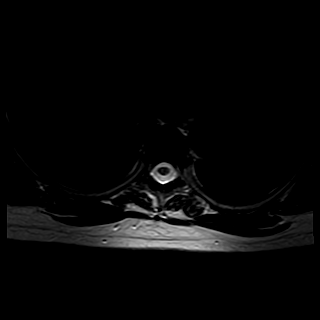
[im 14/18]
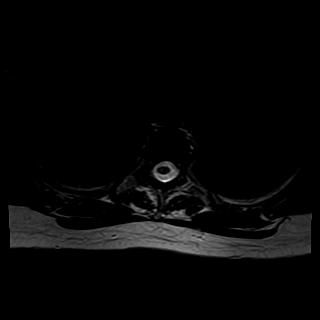
[im 16/18]
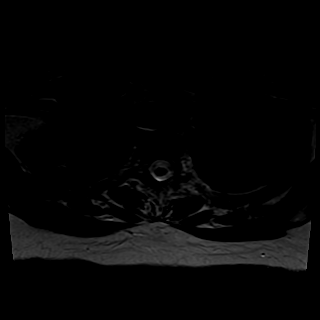
[im 18/18]
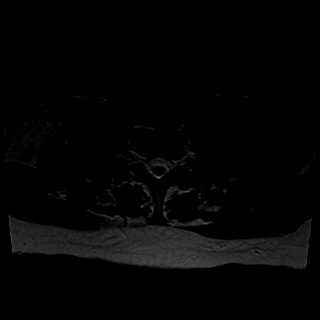

[Series 9: T1 · axial · 4.0mm · 0.62mm/px · z∈[-254,-150]mm · 3 of 18 slices shown (2 of 2)]
[im 4/18]
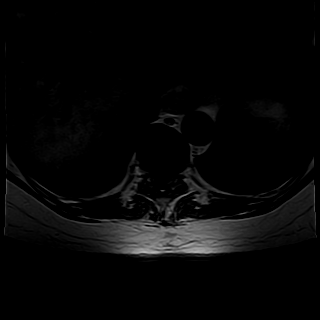
[im 10/18]
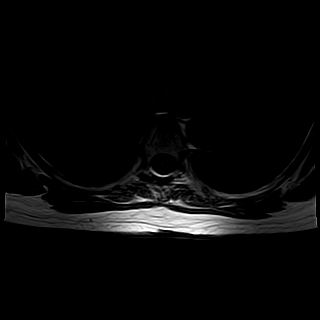
[im 16/18]
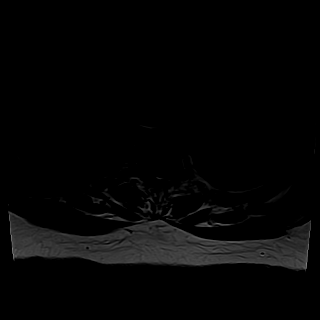

[26 of 48 positions shown; findings below may reference images not displayed]

FINDINGS: Vertebral bodies are normal in height, alignment and signal intensity. There is no acute fracture or subluxation. Visualized spinal cord is normal in signal intensity without evidence of compression at any level.

There are small central disc bulges at T6-7, T9-10 and T12-L1 levels, mildly effacing the ventral thecal sac. There is no significant disc herniation, spinal canal or neural foraminal stenosis at any level.

Paraspinal soft tissues are unremarkable. There is no pleural effusion.
IMPRESSION: Mild multilevel degenerative changes as detailed above.

## 2021-08-20 ENCOUNTER — Other Ambulatory Visit (INDEPENDENT_AMBULATORY_CARE_PROVIDER_SITE_OTHER): Payer: Self-pay | Admitting: NURSE PRACTITIONER

## 2021-08-21 ENCOUNTER — Other Ambulatory Visit (INDEPENDENT_AMBULATORY_CARE_PROVIDER_SITE_OTHER): Payer: Self-pay | Admitting: NURSE PRACTITIONER

## 2021-11-15 ENCOUNTER — Other Ambulatory Visit (INDEPENDENT_AMBULATORY_CARE_PROVIDER_SITE_OTHER): Payer: Self-pay | Admitting: NURSE PRACTITIONER

## 2021-12-17 ENCOUNTER — Other Ambulatory Visit (RURAL_HEALTH_CENTER): Payer: Self-pay | Admitting: Family

## 2021-12-21 ENCOUNTER — Other Ambulatory Visit (RURAL_HEALTH_CENTER): Payer: Self-pay | Admitting: Family

## 2021-12-21 MED ORDER — PANTOPRAZOLE 40 MG TABLET,DELAYED RELEASE
40.0000 mg | DELAYED_RELEASE_TABLET | Freq: Every day | ORAL | 0 refills | Status: DC
Start: 2021-12-21 — End: 2021-12-24

## 2021-12-21 NOTE — Telephone Encounter (Signed)
Please contact for f/u appointment

## 2021-12-24 ENCOUNTER — Encounter (RURAL_HEALTH_CENTER): Payer: Self-pay | Admitting: Family

## 2021-12-24 ENCOUNTER — Ambulatory Visit (RURAL_HEALTH_CENTER): Payer: Medicare PPO | Attending: Family | Admitting: Family

## 2021-12-24 ENCOUNTER — Other Ambulatory Visit: Payer: Self-pay

## 2021-12-24 DIAGNOSIS — Z136 Encounter for screening for cardiovascular disorders: Secondary | ICD-10-CM

## 2021-12-24 DIAGNOSIS — M810 Age-related osteoporosis without current pathological fracture: Secondary | ICD-10-CM | POA: Insufficient documentation

## 2021-12-24 DIAGNOSIS — R609 Edema, unspecified: Secondary | ICD-10-CM | POA: Insufficient documentation

## 2021-12-24 DIAGNOSIS — I493 Ventricular premature depolarization: Secondary | ICD-10-CM | POA: Insufficient documentation

## 2021-12-24 DIAGNOSIS — K580 Irritable bowel syndrome with diarrhea: Secondary | ICD-10-CM | POA: Insufficient documentation

## 2021-12-24 DIAGNOSIS — K5792 Diverticulitis of intestine, part unspecified, without perforation or abscess without bleeding: Secondary | ICD-10-CM | POA: Insufficient documentation

## 2021-12-24 DIAGNOSIS — Z8739 Personal history of other diseases of the musculoskeletal system and connective tissue: Secondary | ICD-10-CM | POA: Insufficient documentation

## 2021-12-24 DIAGNOSIS — J4520 Mild intermittent asthma, uncomplicated: Secondary | ICD-10-CM | POA: Insufficient documentation

## 2021-12-24 DIAGNOSIS — K219 Gastro-esophageal reflux disease without esophagitis: Secondary | ICD-10-CM | POA: Insufficient documentation

## 2021-12-24 DIAGNOSIS — R600 Localized edema: Secondary | ICD-10-CM | POA: Insufficient documentation

## 2021-12-24 DIAGNOSIS — M797 Fibromyalgia: Secondary | ICD-10-CM | POA: Insufficient documentation

## 2021-12-24 DIAGNOSIS — M1711 Unilateral primary osteoarthritis, right knee: Secondary | ICD-10-CM | POA: Insufficient documentation

## 2021-12-24 MED ORDER — PANTOPRAZOLE 40 MG TABLET,DELAYED RELEASE
40.0000 mg | DELAYED_RELEASE_TABLET | Freq: Every day | ORAL | 1 refills | Status: DC
Start: 2021-12-24 — End: 2022-05-05

## 2021-12-24 MED ORDER — SPIRONOLACTONE 25 MG TABLET
25.0000 mg | ORAL_TABLET | Freq: Every morning | ORAL | 1 refills | Status: DC
Start: 2021-12-24 — End: 2022-05-05

## 2021-12-24 MED ORDER — FAMOTIDINE 20 MG TABLET
20.0000 mg | ORAL_TABLET | Freq: Every evening | ORAL | 1 refills | Status: DC
Start: 2021-12-24 — End: 2022-05-05

## 2021-12-24 NOTE — Nursing Note (Signed)
Patient here for follow up with medication refills.  Patient states that her back is hurting her.  She states that the middle of her back is bad pain.
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
## 2022-03-30 ENCOUNTER — Ambulatory Visit (RURAL_HEALTH_CENTER): Payer: Self-pay | Admitting: Family

## 2022-05-05 ENCOUNTER — Other Ambulatory Visit: Payer: Self-pay

## 2022-05-05 ENCOUNTER — Ambulatory Visit (RURAL_HEALTH_CENTER): Payer: Medicare PPO | Attending: Family | Admitting: Family

## 2022-05-05 ENCOUNTER — Encounter (RURAL_HEALTH_CENTER): Payer: Self-pay | Admitting: Family

## 2022-05-05 VITALS — BP 131/79 | HR 78 | Resp 16 | Ht 64.0 in | Wt 183.0 lb

## 2022-05-05 DIAGNOSIS — J4520 Mild intermittent asthma, uncomplicated: Secondary | ICD-10-CM | POA: Insufficient documentation

## 2022-05-05 DIAGNOSIS — K580 Irritable bowel syndrome with diarrhea: Secondary | ICD-10-CM | POA: Insufficient documentation

## 2022-05-05 DIAGNOSIS — Z1231 Encounter for screening mammogram for malignant neoplasm of breast: Secondary | ICD-10-CM

## 2022-05-05 DIAGNOSIS — K5792 Diverticulitis of intestine, part unspecified, without perforation or abscess without bleeding: Secondary | ICD-10-CM

## 2022-05-05 DIAGNOSIS — I493 Ventricular premature depolarization: Secondary | ICD-10-CM | POA: Insufficient documentation

## 2022-05-05 DIAGNOSIS — E785 Hyperlipidemia, unspecified: Secondary | ICD-10-CM | POA: Insufficient documentation

## 2022-05-05 DIAGNOSIS — R600 Localized edema: Secondary | ICD-10-CM

## 2022-05-05 DIAGNOSIS — M1711 Unilateral primary osteoarthritis, right knee: Secondary | ICD-10-CM | POA: Insufficient documentation

## 2022-05-05 DIAGNOSIS — Z8739 Personal history of other diseases of the musculoskeletal system and connective tissue: Secondary | ICD-10-CM | POA: Insufficient documentation

## 2022-05-05 DIAGNOSIS — R609 Edema, unspecified: Secondary | ICD-10-CM | POA: Insufficient documentation

## 2022-05-05 DIAGNOSIS — K219 Gastro-esophageal reflux disease without esophagitis: Secondary | ICD-10-CM | POA: Insufficient documentation

## 2022-05-05 DIAGNOSIS — M810 Age-related osteoporosis without current pathological fracture: Secondary | ICD-10-CM | POA: Insufficient documentation

## 2022-05-05 DIAGNOSIS — Z136 Encounter for screening for cardiovascular disorders: Secondary | ICD-10-CM

## 2022-05-05 DIAGNOSIS — M797 Fibromyalgia: Secondary | ICD-10-CM | POA: Insufficient documentation

## 2022-05-05 MED ORDER — SPIRONOLACTONE 50 MG TABLET
50.0000 mg | ORAL_TABLET | Freq: Every morning | ORAL | 1 refills | Status: DC
Start: 2022-05-05 — End: 2022-12-10

## 2022-05-05 MED ORDER — SPIRONOLACTONE 25 MG TABLET
25.0000 mg | ORAL_TABLET | Freq: Every morning | ORAL | 1 refills | Status: DC
Start: 2022-05-05 — End: 2022-05-05

## 2022-05-05 MED ORDER — FAMOTIDINE 20 MG TABLET
20.0000 mg | ORAL_TABLET | Freq: Every evening | ORAL | 1 refills | Status: DC
Start: 2022-05-05 — End: 2023-06-30

## 2022-05-05 MED ORDER — PANTOPRAZOLE 40 MG TABLET,DELAYED RELEASE
40.0000 mg | DELAYED_RELEASE_TABLET | Freq: Every day | ORAL | 1 refills | Status: DC
Start: 2022-05-05 — End: 2022-12-10

## 2022-05-05 NOTE — Nursing Note (Signed)
Patient here for 3 month follow up with medication refills.  Patient states that her upper back is hurting her.  Patient states that she thinks she is getting cellulitis again on her right lower leg.  She has a red spot for about 1 month now.
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
## 2022-07-26 ENCOUNTER — Encounter (INDEPENDENT_AMBULATORY_CARE_PROVIDER_SITE_OTHER): Payer: Self-pay | Admitting: INTERVENTIONAL CARDIOLOGY

## 2022-08-02 ENCOUNTER — Other Ambulatory Visit: Payer: Self-pay

## 2022-08-02 ENCOUNTER — Encounter (INDEPENDENT_AMBULATORY_CARE_PROVIDER_SITE_OTHER): Payer: Self-pay | Admitting: NURSE PRACTITIONER

## 2022-08-02 ENCOUNTER — Ambulatory Visit (INDEPENDENT_AMBULATORY_CARE_PROVIDER_SITE_OTHER): Payer: Medicare PPO | Admitting: NURSE PRACTITIONER

## 2022-08-02 VITALS — BP 120/60 | HR 60 | Ht 64.0 in | Wt 184.0 lb

## 2022-08-02 DIAGNOSIS — I4510 Unspecified right bundle-branch block: Secondary | ICD-10-CM

## 2022-08-02 DIAGNOSIS — R002 Palpitations: Secondary | ICD-10-CM

## 2022-08-02 DIAGNOSIS — I10 Essential (primary) hypertension: Secondary | ICD-10-CM

## 2022-08-02 DIAGNOSIS — I493 Ventricular premature depolarization: Secondary | ICD-10-CM

## 2022-08-02 NOTE — Progress Notes (Signed)
[facility]iology [facility]    Name: Alexandra Coleman  Age: 67 y.o.  Date of Service: 08/02/2022    Primary Care Provider: Selena Lesser, FNP-BC  Chief Complaint:   Chief Complaint   Patient presents with    Follow Up    Hypertension     Last Visit 12.28.21       Subjective:  The patient has no history of CAD.  Heart catheterization in 2020 due to abnormal stress test showed normal coronary anatomy.  LV-gram showed preserved EF of 50-55%.  Echocardiogram in February 2020 showed mild-to-moderately reduced LV function with EF 48% with anteroseptal and lateral hypokinesis, grade 1 diastolic dysfunction.  Holter monitor in December 2019 showed frequent PVCs and a short 3 beat run of V-tach.  Patient did see Dr. May who started her on flecainide.  Was caused her headache so she was changed to metoprolol and symptoms have been well controlled.    08/02/22 The patient is here for routine f/u, her last visit was in December 2021.  She has been having some issues with dizziness, mainly in the mornings.  She states she will stand up and starts losing her balance and feels like the room is spinning.  She will then lay back down in the wearing this still spinning.  She has had 3 episodes in the last several weeks.  She has not been diagnosed with vertigo.  She also reports that she gets very chilled in the evenings and her teeth chattering for several hours and then she will become very hot like hot flashes, but she has not had these in years.  She has not seen an OBGYN in awhile.    Past Medical History:  Past Medical History:   Diagnosis Date    Asthma     Dyspnea     Edema     Esophageal reflux     History of skin cancer     Hypertension     IBS (irritable bowel syndrome)     Palpitations     PVC (premature ventricular contraction)          Social History:  Social History     Tobacco Use   Smoking Status Never   Smokeless Tobacco Never      Social History     Substance and Sexual Activity   Alcohol Use Not  Currently      Social History     Substance and Sexual Activity   Drug Use Never      Current Medications:  Current Outpatient Medications   Medication Sig    famotidine (PEPCID) 20 mg Oral Tablet Take 1 Tablet (20 mg total) by mouth Every evening (Patient not taking: Reported on 08/02/2022)    metoprolol succinate (TOPROL-XL) 25 mg Oral Tablet Sustained Release 24 hr TAKE 1/2 TABLET BY MOUTH EVERY DAY    pantoprazole (PROTONIX) 40 mg Oral Tablet, Delayed Release (E.C.) Take 1 Tablet (40 mg total) by mouth Once a day    spironolactone (ALDACTONE) 50 mg Oral Tablet Take 1 Tablet (50 mg total) by mouth Every morning with breakfast (Patient taking differently: Take 0.5 Tablets (25 mg total) by mouth Once a day)    ubidecarenone (H2Q COQ10 ORAL) Take 1 Capsule by mouth Twice daily     Allergies:  Allergies   Allergen Reactions    Latex Rash    Ciprofloxacin     Codeine     Diphenhydramine Hcl     Levofloxacin  Meloxicam  Other Adverse Reaction (Add comment)     Rapid heart rate      Review of Systems:  Complete ROS was performed and otherwise negative unless noted in HPI.    Vital Signs:  Vitals:    08/02/22 0856 08/02/22 0859 08/02/22 0902   BP: 135/68 125/71 120/60   Pulse: 76 82 60   SpO2: 96%     Weight: 83.5 kg (184 lb)     Height: 1.626 m ('5\' 4"'$ )     BMI: 31.65        Physical Exam:  General: Pt resting comfortably in no acute distress and appears stated age.    Neck: No JVD, no carotid bruit. Neck supple, symmetrical, trachea midline.   Lungs:  Normal respiratory effort, lungs clear to auscultation bilaterally.    Cardiovascular:  Regular rate and rhythm.  Normal S1 and S2 without murmur, gallop, or rub.  Abdomen: Soft, non-tender and bowel sounds normal.    Extremities: Extremities normal, atraumatic, no cyanosis or edema.    Neurologic: Alert and oriented x3.     Assessment:    Palpitations    PVC's (premature ventricular contractions)    Essential hypertension      Plan:   Patient's symptoms do not sound  cardiac.  Recommend she follow-up with ENT and OBGYN for further evaluation.  Also, her LDL is extremely elevated at 183.  Discussed statin therapy but she refuses.  She bought Co Q10, I recommend she try red yeast rice over-the-counter as well.  Return in 1 year.    Orders placed this visit:  Orders Placed This Encounter    EKG (In-Clinic Today)       KENDAL JERVEY is to return to clinic for follow up with the understanding that should symptoms change or worsen she is to call the office or go to the closest emergency department for evaluation.    Isabell Jarvis, APRN,FNP-BC    A portion of this documentation may have been generated using MMODAL voice recognition software and may contain syntax/voice recognition errors.

## 2022-08-09 ENCOUNTER — Ambulatory Visit (RURAL_HEALTH_CENTER): Payer: Self-pay | Admitting: Family

## 2022-09-15 LAB — ECG W INTERP (AMB USE ONLY)(MUSE,IN CLINIC)
Atrial Rate: 74 {beats}/min
Calculated P Axis: 74 degrees
Calculated R Axis: 53 degrees
Calculated T Axis: 51 degrees
PR Interval: 152 ms
QRS Duration: 120 ms
QT Interval: 432 ms
QTC Calculation: 479 ms
Ventricular rate: 74 {beats}/min

## 2022-11-16 ENCOUNTER — Other Ambulatory Visit (INDEPENDENT_AMBULATORY_CARE_PROVIDER_SITE_OTHER): Payer: Self-pay | Admitting: NURSE PRACTITIONER

## 2022-12-10 ENCOUNTER — Other Ambulatory Visit (RURAL_HEALTH_CENTER): Payer: Self-pay | Admitting: Family

## 2023-03-24 ENCOUNTER — Telehealth (RURAL_HEALTH_CENTER): Payer: Self-pay | Admitting: Family

## 2023-03-24 NOTE — Telephone Encounter (Signed)
Patient called saying she has had 2 falls within a week on concrete. She stated she fell on both of her elbows and hurt them. She stated that the left elbow hurts in the bend as if she tore a ligament or something. She also said the right elbow "hurts like heck" and sends a pain up and down her arm if she barely touches it. She stated that there are no redness, swelling, bruising, or heat to either elbow. She uses [facility] for x-rays.

## 2023-03-25 ENCOUNTER — Other Ambulatory Visit (RURAL_HEALTH_CENTER): Payer: Self-pay | Admitting: INTERNAL MEDICINE

## 2023-03-25 DIAGNOSIS — W19XXXA Unspecified fall, initial encounter: Secondary | ICD-10-CM

## 2023-03-25 DIAGNOSIS — M25521 Pain in right elbow: Secondary | ICD-10-CM

## 2023-03-25 NOTE — Telephone Encounter (Signed)
Order printed

## 2023-03-25 NOTE — Telephone Encounter (Signed)
Patient notified

## 2023-03-29 ENCOUNTER — Telehealth (RURAL_HEALTH_CENTER): Payer: Self-pay | Admitting: INTERNAL MEDICINE

## 2023-03-29 ENCOUNTER — Other Ambulatory Visit (RURAL_HEALTH_CENTER): Payer: Self-pay | Admitting: INTERNAL MEDICINE

## 2023-03-29 DIAGNOSIS — M25521 Pain in right elbow: Secondary | ICD-10-CM

## 2023-03-29 NOTE — Telephone Encounter (Signed)
X-ray didn't show any breaks, swelling or anything like that  Did some some calcification of the tendons, but that is a chronic issue.  If its still bothering her we can try referring to ortho or schedule for physical therapy with eventual goal of getting MRI.

## 2023-03-29 NOTE — Telephone Encounter (Signed)
Pt wants referral to ortho to dr Lequita Halt or Lindwood Qua

## 2023-06-30 ENCOUNTER — Other Ambulatory Visit: Payer: Self-pay

## 2023-06-30 ENCOUNTER — Ambulatory Visit (HOSPITAL_COMMUNITY)
Admission: RE | Admit: 2023-06-30 | Discharge: 2023-06-30 | Disposition: A | Discharge status: Home / Self Care | Payer: Medicare PPO | Source: Ambulatory Visit | Attending: NURSE PRACTITIONER | Admitting: NURSE PRACTITIONER

## 2023-06-30 ENCOUNTER — Encounter (HOSPITAL_COMMUNITY): Payer: Self-pay

## 2023-06-30 ENCOUNTER — Other Ambulatory Visit (HOSPITAL_COMMUNITY): Payer: Self-pay | Admitting: NURSE PRACTITIONER

## 2023-06-30 ENCOUNTER — Inpatient Hospital Stay (HOSPITAL_COMMUNITY): Payer: Medicare PPO | Admitting: HOSPITALIST

## 2023-06-30 ENCOUNTER — Observation Stay
Admission: EM | Admit: 2023-06-30 | Discharge: 2023-07-02 | Disposition: A | Discharge status: Home / Self Care | Payer: Medicare PPO | Attending: HOSPITALIST | Admitting: HOSPITALIST

## 2023-06-30 DIAGNOSIS — K580 Irritable bowel syndrome with diarrhea: Secondary | ICD-10-CM | POA: Insufficient documentation

## 2023-06-30 DIAGNOSIS — R197 Diarrhea, unspecified: Secondary | ICD-10-CM

## 2023-06-30 DIAGNOSIS — K5730 Diverticulosis of large intestine without perforation or abscess without bleeding: Secondary | ICD-10-CM | POA: Insufficient documentation

## 2023-06-30 DIAGNOSIS — I10 Essential (primary) hypertension: Secondary | ICD-10-CM | POA: Insufficient documentation

## 2023-06-30 DIAGNOSIS — J4520 Mild intermittent asthma, uncomplicated: Secondary | ICD-10-CM | POA: Insufficient documentation

## 2023-06-30 DIAGNOSIS — K529 Noninfective gastroenteritis and colitis, unspecified: Secondary | ICD-10-CM

## 2023-06-30 DIAGNOSIS — K2980 Duodenitis without bleeding: Secondary | ICD-10-CM | POA: Insufficient documentation

## 2023-06-30 DIAGNOSIS — K2970 Gastritis, unspecified, without bleeding: Secondary | ICD-10-CM

## 2023-06-30 DIAGNOSIS — R1032 Left lower quadrant pain: Secondary | ICD-10-CM

## 2023-06-30 DIAGNOSIS — E785 Hyperlipidemia, unspecified: Secondary | ICD-10-CM | POA: Insufficient documentation

## 2023-06-30 DIAGNOSIS — K2990 Gastroduodenitis, unspecified, without bleeding: Secondary | ICD-10-CM

## 2023-06-30 DIAGNOSIS — Z8679 Personal history of other diseases of the circulatory system: Secondary | ICD-10-CM

## 2023-06-30 DIAGNOSIS — M797 Fibromyalgia: Secondary | ICD-10-CM | POA: Insufficient documentation

## 2023-06-30 DIAGNOSIS — Z8619 Personal history of other infectious and parasitic diseases: Secondary | ICD-10-CM | POA: Insufficient documentation

## 2023-06-30 DIAGNOSIS — I493 Ventricular premature depolarization: Secondary | ICD-10-CM | POA: Insufficient documentation

## 2023-06-30 DIAGNOSIS — Z79899 Other long term (current) drug therapy: Secondary | ICD-10-CM | POA: Insufficient documentation

## 2023-06-30 DIAGNOSIS — K219 Gastro-esophageal reflux disease without esophagitis: Secondary | ICD-10-CM | POA: Insufficient documentation

## 2023-06-30 DIAGNOSIS — R001 Bradycardia, unspecified: Secondary | ICD-10-CM | POA: Insufficient documentation

## 2023-06-30 DIAGNOSIS — Z87440 Personal history of urinary (tract) infections: Secondary | ICD-10-CM | POA: Insufficient documentation

## 2023-06-30 DIAGNOSIS — K5732 Diverticulitis of large intestine without perforation or abscess without bleeding: Secondary | ICD-10-CM

## 2023-06-30 DIAGNOSIS — K5792 Diverticulitis of intestine, part unspecified, without perforation or abscess without bleeding: Principal | ICD-10-CM | POA: Insufficient documentation

## 2023-06-30 DIAGNOSIS — J45909 Unspecified asthma, uncomplicated: Secondary | ICD-10-CM

## 2023-06-30 LAB — CBC WITH DIFF
BASOPHIL #: 0.1 10*3/uL (ref 0.00–0.10)
BASOPHIL %: 1 % (ref 0–1)
EOSINOPHIL #: 0.2 10*3/uL (ref 0.00–0.50)
EOSINOPHIL %: 2 % (ref 1–7)
HCT: 43.1 % — ABNORMAL HIGH (ref 31.2–41.9)
HGB: 14.3 g/dL (ref 10.9–14.3)
LYMPHOCYTE #: 2.3 10*3/uL (ref 1.00–3.00)
LYMPHOCYTE %: 28 % (ref 16–44)
MCH: 27.7 pg (ref 24.7–32.8)
MCHC: 33.2 g/dL (ref 32.3–35.6)
MCV: 83.3 fL (ref 75.5–95.3)
MONOCYTE #: 0.6 10*3/uL (ref 0.30–1.00)
MONOCYTE %: 8 % (ref 5–13)
MPV: 8.2 fL (ref 7.9–10.8)
NEUTROPHIL #: 4.9 10*3/uL (ref 1.85–7.80)
NEUTROPHIL %: 61 % (ref 43–77)
PLATELETS: 254 10*3/uL (ref 140–440)
RBC: 5.17 10*6/uL — ABNORMAL HIGH (ref 3.63–4.92)
RDW: 14.7 % (ref 12.3–17.7)
WBC: 8 10*3/uL (ref 3.8–11.8)

## 2023-06-30 LAB — COMPREHENSIVE METABOLIC PANEL, NON-FASTING
ALBUMIN/GLOBULIN RATIO: 1.3 (ref 0.8–1.4)
ALBUMIN: 4.4 g/dL (ref 3.5–5.7)
ALKALINE PHOSPHATASE: 91 U/L (ref 34–104)
ALT (SGPT): 16 U/L (ref 7–52)
ANION GAP: 9 mmol/L (ref 4–13)
AST (SGOT): 17 U/L (ref 13–39)
BILIRUBIN TOTAL: 0.5 mg/dL (ref 0.3–1.0)
BUN/CREA RATIO: 13 (ref 6–22)
BUN: 10 mg/dL (ref 7–25)
CALCIUM, CORRECTED: 10 mg/dL (ref 8.9–10.8)
CALCIUM: 10.3 mg/dL (ref 8.6–10.3)
CHLORIDE: 103 mmol/L (ref 98–107)
CO2 TOTAL: 25 mmol/L (ref 21–31)
CREATININE: 0.76 mg/dL (ref 0.60–1.30)
ESTIMATED GFR: 85 mL/min/{1.73_m2} (ref >59)
GLOBULIN: 3.3 (ref 2.0–3.5)
GLUCOSE: 93 mg/dL (ref 74–109)
OSMOLALITY, CALCULATED: 273 mosm/kg (ref 270–290)
POTASSIUM: 4 mmol/L (ref 3.5–5.1)
PROTEIN TOTAL: 7.7 g/dL (ref 6.4–8.9)
SODIUM: 137 mmol/L (ref 136–145)

## 2023-06-30 LAB — THYROID STIMULATING HORMONE WITH FREE T4 REFLEX: TSH: 2.967 u[IU]/mL (ref 0.450–5.330)

## 2023-06-30 LAB — C-REACTIVE PROTEIN (CRP): C-REACTIVE PROTEIN (CRP): 14.3 mg/dL — ABNORMAL HIGH (ref 0.1–0.5)

## 2023-06-30 LAB — MAGNESIUM: MAGNESIUM: 1.9 mg/dL (ref 1.9–2.7)

## 2023-06-30 LAB — BLUE TOP TUBE

## 2023-06-30 LAB — LACTIC ACID LEVEL W/ REFLEX FOR LEVEL >2.0: LACTIC ACID: 1.1 mmol/L (ref 0.5–2.2)

## 2023-06-30 LAB — GOLD TOP TUBE

## 2023-06-30 LAB — C. DIFFICILE PCR
C. DIFFICILE TOXIN GENE, PCR: NEGATIVE
PRESUMPTIVE 027/NAP1/BI: NEGATIVE

## 2023-06-30 MED ORDER — ONDANSETRON HCL (PF) 4 MG/2 ML INJECTION SOLUTION
INTRAMUSCULAR | Status: AC
Start: 2023-06-30 — End: 2023-06-30
  Filled 2023-06-30: qty 2

## 2023-06-30 MED ORDER — MORPHINE 2 MG/ML INJECTION WRAPPER
INJECTION | INTRAMUSCULAR | Status: AC
Start: 2023-06-30 — End: 2023-06-30
  Filled 2023-06-30: qty 1

## 2023-06-30 MED ORDER — ONDANSETRON HCL (PF) 4 MG/2 ML INJECTION SOLUTION
4.0000 mg | Freq: Four times a day (QID) | INTRAMUSCULAR | Status: DC | PRN
Start: 2023-06-30 — End: 2023-07-02

## 2023-06-30 MED ORDER — PANTOPRAZOLE 40 MG TABLET,DELAYED RELEASE
40.0000 mg | DELAYED_RELEASE_TABLET | Freq: Every day | ORAL | Status: DC
Start: 2023-06-30 — End: 2023-07-02
  Administered 2023-06-30: 0 mg via ORAL
  Administered 2023-07-01 – 2023-07-02 (×2): 40 mg via ORAL
  Filled 2023-06-30 (×2): qty 1

## 2023-06-30 MED ORDER — SODIUM CHLORIDE 0.9 % INTRAVENOUS PIGGYBACK
INJECTION | INTRAVENOUS | Status: AC
Start: 2023-06-30 — End: 2023-06-30
  Filled 2023-06-30: qty 100

## 2023-06-30 MED ORDER — IPRATROPIUM 0.5 MG-ALBUTEROL 3 MG (2.5 MG BASE)/3 ML NEBULIZATION SOLN
3.0000 mL | INHALATION_SOLUTION | RESPIRATORY_TRACT | Status: DC | PRN
Start: 2023-06-30 — End: 2023-07-02

## 2023-06-30 MED ORDER — SODIUM CHLORIDE 0.9 % INTRAVENOUS PIGGYBACK
4.5000 g | Freq: Three times a day (TID) | INTRAVENOUS | Status: DC
Start: 2023-07-01 — End: 2023-07-02
  Administered 2023-07-01 (×2): 0 g via INTRAVENOUS
  Administered 2023-07-01 (×3): 4.5 g via INTRAVENOUS
  Administered 2023-07-01 – 2023-07-02 (×2): 0 g via INTRAVENOUS
  Administered 2023-07-02 (×2): 4.5 g via INTRAVENOUS
  Filled 2023-06-30 (×5): qty 20

## 2023-06-30 MED ORDER — PANTOPRAZOLE 40 MG INTRAVENOUS SOLUTION
40.0000 mg | INTRAVENOUS | Status: AC
Start: 2023-06-30 — End: 2023-06-30
  Administered 2023-06-30: 40 mg via INTRAVENOUS

## 2023-06-30 MED ORDER — SODIUM CHLORIDE 0.9 % INTRAVENOUS SOLUTION
INTRAVENOUS | Status: DC
Start: 2023-06-30 — End: 2023-07-01
  Administered 2023-07-01: 0 mL via INTRAVENOUS

## 2023-06-30 MED ORDER — PIPERACILLIN-TAZOBACTAM 4.5 GRAM INTRAVENOUS SOLUTION
INTRAVENOUS | Status: AC
Start: 2023-06-30 — End: 2023-06-30
  Filled 2023-06-30: qty 20

## 2023-06-30 MED ORDER — SODIUM CHLORIDE 0.9 % INTRAVENOUS PIGGYBACK
4.5000 g | INTRAVENOUS | Status: AC
Start: 2023-06-30 — End: 2023-06-30
  Administered 2023-06-30: 4.5 g via INTRAVENOUS
  Administered 2023-06-30: 0 g via INTRAVENOUS

## 2023-06-30 MED ORDER — METOPROLOL SUCCINATE ER 25 MG TABLET,EXTENDED RELEASE 24 HR
12.5000 mg | ORAL_TABLET | Freq: Every day | ORAL | Status: DC
Start: 2023-06-30 — End: 2023-07-02
  Administered 2023-06-30 – 2023-07-01 (×2): 12.5 mg via ORAL
  Filled 2023-06-30: qty 1

## 2023-06-30 MED ORDER — ONDANSETRON HCL (PF) 4 MG/2 ML INJECTION SOLUTION
4.0000 mg | INTRAMUSCULAR | Status: AC
Start: 2023-06-30 — End: 2023-06-30
  Administered 2023-06-30: 4 mg via INTRAVENOUS

## 2023-06-30 MED ORDER — ACETAMINOPHEN 325 MG TABLET
650.0000 mg | ORAL_TABLET | ORAL | Status: DC | PRN
Start: 2023-06-30 — End: 2023-07-02
  Administered 2023-07-01: 650 mg via ORAL
  Filled 2023-06-30: qty 2

## 2023-06-30 MED ORDER — MORPHINE 2 MG/ML INJECTION WRAPPER
2.0000 mg | INJECTION | INTRAMUSCULAR | Status: AC
Start: 2023-06-30 — End: 2023-06-30
  Administered 2023-06-30: 2 mg via INTRAVENOUS

## 2023-06-30 MED ORDER — ENOXAPARIN 40 MG/0.4 ML SUBCUTANEOUS SYRINGE
40.0000 mg | INJECTION | SUBCUTANEOUS | Status: DC
Start: 2023-06-30 — End: 2023-07-02
  Administered 2023-06-30 – 2023-07-01 (×2): 40 mg via SUBCUTANEOUS
  Filled 2023-06-30: qty 0.4

## 2023-06-30 MED ORDER — PANTOPRAZOLE 40 MG INTRAVENOUS SOLUTION
INTRAVENOUS | Status: AC
Start: 2023-06-30 — End: 2023-06-30
  Filled 2023-06-30: qty 10

## 2023-06-30 MED ORDER — ENOXAPARIN 40 MG/0.4 ML SUBCUTANEOUS SYRINGE
INJECTION | SUBCUTANEOUS | Status: AC
Start: 2023-06-30 — End: 2023-06-30
  Filled 2023-06-30: qty 0.4

## 2023-06-30 MED ORDER — METOPROLOL SUCCINATE ER 25 MG TABLET,EXTENDED RELEASE 24 HR
ORAL_TABLET | ORAL | Status: AC
Start: 2023-06-30 — End: 2023-06-30
  Filled 2023-06-30: qty 1

## 2023-06-30 MED ORDER — IOHEXOL 350 MG IODINE/ML INTRAVENOUS SOLUTION
75.0000 mL | INTRAVENOUS | Status: AC
Start: 2023-06-30 — End: 2023-06-30
  Administered 2023-06-30: 75 mL via INTRAVENOUS

## 2023-06-30 NOTE — ED Nurses Note (Signed)
Patient here today with c/o abnormal CT and diarrhea. Patient states that she was sent by her PCP. Patient states that the CT report showed inflammation that was higher on her bowel. Patient states that she has a hx of diverticulitis. Patient states that she has had diarrhea x 4 weeks, but it became worse two days ago.

## 2023-06-30 NOTE — ED Triage Notes (Signed)
Patient presents to the ED for inflammation pushing up into her stomach. Patient has diverticulitis and diarrhea due to diverticulitis. Patient had CT scan done today.

## 2023-06-30 NOTE — ED Nurses Note (Signed)
Report called to April, RN on [address] [place].

## 2023-06-30 NOTE — H&P (Signed)
[place] [facility]    HOSPITALIST H&P    KAITLIN ALCINDOR 68 y.o. female ED26/ED26   Date of Service: 06/30/2023    Date of Admission:  06/30/2023   PCP: No Pcp Code Status:FULL CODE: ATTEMPT RESUSCITATION/CPR       Chief Complaint:  "Diarrhea, abdominal pain"    HPI:     This is a 68 year old female with a PMH of asthma, esophageal reflux, hypertension, HLD, history of PVCs, and irritable bowel syndrome who presented to the ER with complaints of abdominal pain, diarrhea and an abnormal CT scan.    Patient reported that she has been having diarrhea for the last 4 weeks associated with left lower quadrant abdominal pain.  She presented to her PCP on 01/22 and was prescribed p.o. Augmentin and CT abdomen/pelvis was ordered.    CT abdomen/pelvis showed findings suggestive of mild diverticulitis and her PCP asked her to present to the ER.    Patient reported a past history of diverticulitis about 10 years ago treated with ciprofloxacin and Flagyl, which was eventually was complicated by C diff infection.      CT abdomen/pelvis:   1.Diverticulosis. There are inflammatory changes at the junction of the distal descending and proximal sigmoid colon that may represent mild diverticulitis. Epiploic appendagitis could have a similar appearance . There is no associated drainable abscess or pneumoperitoneum  2.Mural thickening of the stomach and duodenum may be postinflammatory, suggesting gastritis and duodenitis                        ED medications:   Medications Administered in the ED   NS premix infusion (has no administration in time range)   morphine 2 mg/mL injection (has no administration in time range)   ondansetron (ZOFRAN) 2 mg/mL injection (has no administration in time range)         PMHx:    Past Medical History:   Diagnosis Date    Asthma     Dyspnea     Edema     Esophageal reflux     History of skin cancer     Hypertension     IBS (irritable bowel syndrome)     Palpitations     PVC  (premature ventricular contraction)         PSHx:   Past Surgical History:   Procedure Laterality Date    BASAL CELL CARCINOMA EXCISION      CARDIAC CATHETERIZATION  07/2018    [facility]  [place]    HX APPENDECTOMY      HX CHOLECYSTECTOMY      HX COLONOSCOPY            Allergies:    Allergies   Allergen Reactions    Latex Rash    Ciprofloxacin     Codeine     Diphenhydramine Hcl     Levofloxacin     Meloxicam  Other Adverse Reaction (Add comment)     Rapid heart rate    Social History  Social History     Tobacco Use    Smoking status: Never    Smokeless tobacco: Never   Vaping Use    Vaping status: Never Used   Substance Use Topics    Alcohol use: Not Currently    Drug use: Never       Family History  Family Medical History:       Problem Relation (Age of Onset)    Cancer Mother  Diabetes Sister    Diabetes type II Mother    Heart Disease Mother, Father    Hypertension (High Blood Pressure) Mother, Sister    Melanoma Sister    Thyroid Disease Mother, Sister               Home Meds:      Prior to Admission medications    Medication Sig Start Date End Date Taking? Authorizing Provider   amoxicillin-pot clavulanate (AUGMENTIN) 875-125 mg Oral Tablet Take 1 Tablet by mouth Three times a day 06/29/23 07/10/23 Yes Provider, Historical   fluorouraciL (EFUDEX) 5 % Cream APPLY TWO TIMES A DAY TO SKIN CANCER X 4 WEEKS. WASH OFF IN AM.   Yes Provider, Historical   metoprolol succinate (TOPROL-XL) 25 mg Oral Tablet Sustained Release 24 hr TAKE 1/2 TABLET BY MOUTH EVERY DAY 11/16/22  Yes Huffman, Emilee J, APRN,FNP-BC   pantoprazole (PROTONIX) 40 mg Oral Tablet, Delayed Release (E.C.) TAKE 1 TABLET BY MOUTH ONCE A DAY 12/10/22  Yes Virgie Dad, FNP-BC   spironolactone (ALDACTONE) 50 mg Oral Tablet TAKE 1 TABLET (50 MG TOTAL) BY MOUTH EVERY MORNING BEFORE BREAKFAST 12/10/22  Yes Virgie Dad, FNP-BC   famotidine (PEPCID) 20 mg Oral Tablet Take 1 Tablet (20 mg total) by mouth Every evening  Patient not taking: Reported on  08/02/2022 05/05/22 06/30/23  Virgie Dad, FNP-BC   ubidecarenone (H2Q COQ10 ORAL) Take 1 Capsule by mouth Twice daily  06/30/23  Provider, Historical          ROS:   General: No fever or chills. No weight changes, +weakness.   HEENT: No headaches, dizziness, changes in vision, changes in hearing, or difficulty swallowing.    Skin:  No rashes, erythema or bruises.   Cardiac: No chest pain, palpitations, or arrhythmia.    Respiratory: No shortness of breath, cough, or wheezing.  GI: No nausea or vomiting. + abdominal pain.   Urinary: No dysuria, hematuria, or change in frequency.    Vascular: No edema.     Musculoskeletal: No muscle weakness, pain, or decreased range of motion.   Neurologic: No loss of sensation, numbness or tingling.   Endocrine: No heat or cold intolerance or polydipsia.   Psychiatric: No insomnia, depression or anxiety.      Results for orders placed or performed during the hospital encounter of 06/30/23 (from the past 24 hour(s))   COMPREHENSIVE METABOLIC PANEL, NON-FASTING   Result Value Ref Range    SODIUM 137 136 - 145 mmol/L    POTASSIUM 4.0 3.5 - 5.1 mmol/L    CHLORIDE 103 98 - 107 mmol/L    CO2 TOTAL 25 21 - 31 mmol/L    ANION GAP 9 4 - 13 mmol/L    BUN 10 7 - 25 mg/dL    CREATININE 4.03 4.74 - 1.30 mg/dL    BUN/CREA RATIO 13 6 - 22    ESTIMATED GFR 85 >59 mL/min/1.33m^2    ALBUMIN 4.4 3.5 - 5.7 g/dL    CALCIUM 25.9 8.6 - 56.3 mg/dL    GLUCOSE 93 74 - 875 mg/dL    ALKALINE PHOSPHATASE 91 34 - 104 U/L    ALT (SGPT) 16 7 - 52 U/L    AST (SGOT) 17 13 - 39 U/L    BILIRUBIN TOTAL 0.5 0.3 - 1.0 mg/dL    PROTEIN TOTAL 7.7 6.4 - 8.9 g/dL    ALBUMIN/GLOBULIN RATIO 1.3 0.8 - 1.4    OSMOLALITY, CALCULATED 273 270 - 290 mOsm/kg  CALCIUM, CORRECTED 10.0 8.9 - 10.8 mg/dL    GLOBULIN 3.3 2.0 - 3.5   LACTIC ACID LEVEL W/ REFLEX FOR LEVEL >2.0   Result Value Ref Range    LACTIC ACID 1.1 0.5 - 2.2 mmol/L   CBC WITH DIFF   Result Value Ref Range    WBC 8.0 3.8 - 11.8 x10^3/uL    RBC 5.17 (H) 3.63 - 4.92  x10^6/uL    HGB 14.3 10.9 - 14.3 g/dL    HCT 16.1 (H) 09.6 - 41.9 %    MCV 83.3 75.5 - 95.3 fL    MCH 27.7 24.7 - 32.8 pg    MCHC 33.2 32.3 - 35.6 g/dL    RDW 04.5 40.9 - 81.1 %    PLATELETS 254 140 - 440 x10^3/uL    MPV 8.2 7.9 - 10.8 fL    NEUTROPHIL % 61 43 - 77 %    LYMPHOCYTE % 28 16 - 44 %    MONOCYTE % 8 5 - 13 %    EOSINOPHIL % 2 1 - 7 %    BASOPHIL % 1 0 - 1 %    NEUTROPHIL # 4.90 1.85 - 7.80 x10^3/uL    LYMPHOCYTE # 2.30 1.00 - 3.00 x10^3/uL    MONOCYTE # 0.60 0.30 - 1.00 x10^3/uL    EOSINOPHIL # 0.20 0.00 - 0.50 x10^3/uL    BASOPHIL # 0.10 0.00 - 0.10 x10^3/uL   MAGNESIUM   Result Value Ref Range    MAGNESIUM 1.9 1.9 - 2.7 mg/dL   BLUE TOP TUBE   Result Value Ref Range    RAINBOW/EXTRA TUBE AUTO RESULT Yes           Physical:  Filed Vitals:    06/30/23 1409   BP: 125/76   Pulse: 76   Resp: 18   Temp: 36.6 C (97.9 F)   SpO2: 100%      General: Patient is alert and oriented to person, place, and time. No acute distress.  Head: Normocephalic and atraumatic.    Eyes: Pupils equally round and react to light  Throat: Moist oral mucosa  Neck: Supple  Heart: Regular rate and rhythm  Lungs: Clear to auscultation bilaterally with no wheezes or rales. No conversational dyspnea.  Abdomen: Soft, +tender, nondistended belly. Bowel sounds are present   Extremities: No edema, cyanosis, or clubbing. Grossly moves all extremities.    Skin: Warm and dry without lesions  Neurologic: Cranial nerves II through XII are grossly intact. Sensation to light touch is intact. Strength 5/5 in upper extremities and lower extremities bilaterally.    Genitourinary:  No urinary incontinence or Foley catheter   Psychiatric: Judgment and insight are intact. Mood and affect are appropriate for the situation.       Diagnostic studies:  No results found.       EKG interpretation:NA      @PEVF @    Assessments:  Active Hospital Problems   (*Primary Problem)    Diagnosis    *Diverticulitis    Hyperlipidemia    Gastroesophageal reflux disease  without esophagitis    Irritable bowel syndrome with diarrhea    Fibromyalgia    Mild intermittent asthma without complication    PVC's (premature ventricular contractions)     Abdominal pain   Persistent diarrhea   Mild diverticulitis  -IV Zosyn   -follow up C diff and stool BioFire   -GI consulted for gastric and duodenal thickening  -IV fluid  -clear liquid diet.  Advance  as tolerated    -Monitor hemodynamics closely          Comorbidities   hypertension   HLD   Esophageal reflux   History of PVCs  Asthma     -home meds as ordered      Code status: FULL CODE: ATTEMPT RESUSCITATION/CPR  DVT prophylaxis:  Lovenox    Diet: DIET CLEAR LIQUID Do you want to initiate MNT Protocol? Yes          Delight Ovens, MD    [place] [facility] HOSPITALIST      This note was partially created using voice recognition software and is inherently subject to errors including those of syntax and "sound alike " substitutions which may escape proof reading. In such instances, original meaning may be extrapolated by contextual derivation.

## 2023-06-30 NOTE — ED APP Handoff Note (Signed)
[facility] - Emergency Department  Emergency Department  Provider in Triage Note    Name: Alexandra Coleman  Age: 68 y.o.  Gender: female     Subjective:   Alexandra Coleman is a 68 y.o. female who presents with complaint of Abdominal Pain  .  Patient presents to ED for abdominal pain.  Patient was sent over from CT as she does have diverticulitis as seen on that CT.    Objective:   Filed Vitals:    06/30/23 1409   BP: 125/76   Pulse: 76   Resp: 18   Temp: 36.6 C (97.9 F)   SpO2: 100%      Vitals are also documented in the EMR.  Focused Physical Exam shows no acute distress.    Assessment:  A medical screening exam was completed.  This patient is a 68 y.o. female with Abdominal Pain  .    Plan:  Please see initial orders and work-up in the EMR.  This is to be continued with full evaluation in the main Emergency Department.     No current facility-administered medications for this encounter.     Results for orders placed or performed during the hospital encounter of 06/30/23 (from the past 24 hour(s))   CBC/DIFF    Narrative    The following orders were created for panel order CBC/DIFF.  Procedure                               Abnormality         Status                     ---------                               -----------         ------                     CBC WITH MVHQ[[id number]]                                                                 Please view results for these tests on the individual orders.          Bunnie Domino FNP-C   06/30/2023, 14:18   Department of Emergency Medicine  [place] [facility]

## 2023-06-30 NOTE — ED Nurses Note (Signed)
Spoke to pt's PCP. She called regarding pt's CT scan and worsening symptoms.

## 2023-06-30 NOTE — ED Provider Notes (Signed)
[facility]  ED Primary Provider Note      Name: Alexandra Coleman  Age and Gender: 68 y.o. female  Date of Birth: Aug 16, 1955  MRN: [id number]  PCP: No Pcp    CC:  Chief Complaint   Patient presents with    Abdominal Pain       HPI:  Alexandra Coleman is a 68 y.o. White female who presents to the ER with diarrhea and abdominal pain.  Patient states that she started to have diarrhea approximately 4 weeks ago.  Unsure of any antibiotic use prior to diarrhea but she states that it has been ongoing.  She states that the abdominal pain started approximately 4-5 days ago and she saw her primary care doctor.  She had an outpatient oral and IV contrasted CT scan today and they sent her here after the reading.  History of C diff from Flagyl and/or Cipro.  Patient started taking oral Augmentin yesterday.  Patient denies any vomiting but diffuse diarrhea.  Denies any short of breath fever chills chest pain  Below pertinent information reviewed with patient:  Past Medical History:   Diagnosis Date    Asthma     Dyspnea     Edema     Esophageal reflux     History of skin cancer     Hypertension     IBS (irritable bowel syndrome)     Palpitations     PVC (premature ventricular contraction)        Allergies   Allergen Reactions    Latex Rash    Ciprofloxacin     Codeine     Diphenhydramine Hcl     Levofloxacin     Meloxicam  Other Adverse Reaction (Add comment)     Rapid heart rate       Past Surgical History:   Procedure Laterality Date    BASAL CELL CARCINOMA EXCISION      CARDIAC CATHETERIZATION  07/2018    [facility]  [place]    HX APPENDECTOMY      HX CHOLECYSTECTOMY      HX COLONOSCOPY          Social History     Socioeconomic History    Marital status: Divorced   Tobacco Use    Smoking status: Never    Smokeless tobacco: Never   Vaping Use    Vaping status: Never Used   Substance and Sexual Activity    Alcohol use: Not Currently    Drug use: Never       ROS:  No other overt positive review  of systems are noted other than stated in the HPI.      Objective:    ED Triage Vitals [06/30/23 1409]   BP (Non-Invasive) 125/76   Heart Rate 76   Respiratory Rate 18   Temperature 36.6 C (97.9 F)   SpO2 100 %   Weight 78.9 kg (174 lb)   Height 1.626 m (5\' 4" )     Filed Vitals:    06/30/23 1845 06/30/23 1900 06/30/23 1915 06/30/23 1918   BP: (!) 152/59 125/62     Pulse:       Resp:    18   Temp:    36.5 C (97.7 F)   SpO2: 99% 97% 96%        Nursing notes and vital signs reviewed.    Constitutional - No acute distress.  Alert and Active.  HEENT - Normocephalic. Atraumatic. PERRL. EOMI. Conjunctiva  clear. TM's pearly grey, translucent, without bulging or retraction. Oropharynx with no erythema, lesions, or exudates. Moist mucous membranes.   Neck - Trachea midline. No stridor. No hoarseness.  Cardiac - Regular rate and rhythm. No murmurs, rubs, or gallops. Intact distal pulses.  Respiratory/Chest - Normal respiratory effort. Clear to auscultation bilaterally. No rales, wheezes or rhonchi. No chest tenderness.  Abdomen - Normal bowel sounds.  Tenderness throughout abdomen.  Musculoskeletal - Good AROM. No muscle or joint tenderness appreciated. No clubbing, cyanosis or edema.  Skin - Warm and dry, without any rashes or other lesions.  Neuro - Alert and oriented x 3. Cranial nerves II-XII are grossly intact.  Moving all extremities symmetrically. Normal gait.  Psych - Normal mood and affect. Behavior is normal          Any pertinent labs and imaging obtained during this encounter reviewed below in MDM.    MDM/ED Course:    Medical Decision Making  Patient presents to the ER with diarrhea and abdominal pain.  Patient states that she started to have diarrhea approximately 4 weeks ago.  Unsure of any antibiotic use prior to diarrhea but she states that it has been ongoing.  She states that the abdominal pain started approximately 4-5 days ago and she saw her primary care doctor.  She had an outpatient oral and IV  contrasted CT scan today and they sent her here after the reading.  History of C diff from Flagyl and/or Cipro.  Patient started taking oral Augmentin yesterday.  Patient denies any vomiting but diffuse diarrhea.  Denies any short of breath fever chills chest pain    Differential diagnoses include:  Diverticulitis, colitis, enteritis, gastritis    Patient underwent diagnostics with results as noted in ED course.    Patient was found/suspected to have diverticulitis without any drainable abscess or pneumoperitoneum.  Patient also has mural thickening of the stomach and duodenum which could be inflammatory suggesting gastritis and duodenitis.  Patient states her pain is 9/10.  Patient received morphine 2 mg along with Zofran 4 mg.  Patient has taken 1 whole day of Augmentin.  Hospitalist will be consulted for antibiotics and pain control, and GI consult.     Patient will be admitted in stable condition at this time.    Amount and/or Complexity of Data Reviewed  ECG/medicine tests: independent interpretation performed.    Risk  Prescription drug management.  Parenteral controlled substances.  Decision regarding hospitalization.            ED Course as of 06/30/23 2003   Thu Jun 30, 2023   1639 CBC/DIFF(!)  WBCs 8.0, hemoglobin 14.3, hematocrit 43.1   1639 COMPREHENSIVE METABOLIC PANEL, NON-FASTING  K 4.0, BUN 10, creatinine 0.76   1640 LACTIC ACID: 1.1       Orders Placed This Encounter    ADULT ROUTINE BLOOD CULTURE, SET OF 2 ADULT BOTTLES (BACTERIA AND YEAST)    ADULT ROUTINE BLOOD CULTURE, SET OF 2 ADULT BOTTLES (BACTERIA AND YEAST)    C. DIFFICILE PCR    GI PANEL BY BIOFIRE FILM ARRAY    CBC/DIFF    COMPREHENSIVE METABOLIC PANEL, NON-FASTING    LACTIC ACID LEVEL W/ REFLEX FOR LEVEL >2.0    CBC WITH DIFF    EXTRA TUBES    BLUE TOP TUBE    GOLD TOP TUBE    MAGNESIUM    CBC/DIFF    BASIC METABOLIC PANEL, NON-FASTING    MAGNESIUM    THYROID STIMULATING  HORMONE WITH FREE T4 REFLEX    HEPATIC FUNCTION PANEL    C-REACTIVE  PROTEIN (CRP)    C-REACTIVE PROTEIN (CRP)    PATIENT CLASS/LEVEL OF CARE DESIGNATION - PRN    morphine 2 mg/mL injection    ondansetron (ZOFRAN) 2 mg/mL injection    NS premix infusion    pantoprazole (PROTONIX) 4 mg/mL injection    metoprolol succinate (TOPROL-XL) 24 hr extended release tablet    pantoprazole (PROTONIX) delayed release tablet    enoxaparin PF (LOVENOX) 40 mg/0.4 mL SubQ injection    ipratropium-albuterol 0.5 mg-3 mg(2.5 mg base)/3 mL Solution for Nebulization    acetaminophen (TYLENOL) tablet    ondansetron (ZOFRAN) 2 mg/mL injection    piperacillin-tazobactam (ZOSYN) 4.5 g in NS 100 mL IVPB minibag    piperacillin-tazobactam (ZOSYN) 4.5 g in NS 100 mL IVPB minibag       Impression:   Clinical Impression   Diverticulitis (Primary)   Colitis   Gastritis and duodenitis       Disposition: Admitted    / M. Toney Sang, APRN, FNP-C 06/30/2023, 16:43  [facility]  Department of Emergency Medicine  [facility]    Portions of this note may have been dictated using voice recognition software.     -----------------------  Results for orders placed or performed during the hospital encounter of 06/30/23 (from the past 12 hour(s))   COMPREHENSIVE METABOLIC PANEL, NON-FASTING   Result Value Ref Range    SODIUM 137 136 - 145 mmol/L    POTASSIUM 4.0 3.5 - 5.1 mmol/L    CHLORIDE 103 98 - 107 mmol/L    CO2 TOTAL 25 21 - 31 mmol/L    ANION GAP 9 4 - 13 mmol/L    BUN 10 7 - 25 mg/dL    CREATININE 1.61 0.96 - 1.30 mg/dL    BUN/CREA RATIO 13 6 - 22    ESTIMATED GFR 85 >59 mL/min/1.85m^2    ALBUMIN 4.4 3.5 - 5.7 g/dL    CALCIUM 04.5 8.6 - 40.9 mg/dL    GLUCOSE 93 74 - 811 mg/dL    ALKALINE PHOSPHATASE 91 34 - 104 U/L    ALT (SGPT) 16 7 - 52 U/L    AST (SGOT) 17 13 - 39 U/L    BILIRUBIN TOTAL 0.5 0.3 - 1.0 mg/dL    PROTEIN TOTAL 7.7 6.4 - 8.9 g/dL    ALBUMIN/GLOBULIN RATIO 1.3 0.8 - 1.4    OSMOLALITY, CALCULATED 273 270 - 290 mOsm/kg    CALCIUM, CORRECTED 10.0 8.9 - 10.8 mg/dL    GLOBULIN  3.3 2.0 - 3.5   LACTIC ACID LEVEL W/ REFLEX FOR LEVEL >2.0   Result Value Ref Range    LACTIC ACID 1.1 0.5 - 2.2 mmol/L   CBC WITH DIFF   Result Value Ref Range    WBC 8.0 3.8 - 11.8 x10^3/uL    RBC 5.17 (H) 3.63 - 4.92 x10^6/uL    HGB 14.3 10.9 - 14.3 g/dL    HCT 91.4 (H) 78.2 - 41.9 %    MCV 83.3 75.5 - 95.3 fL    MCH 27.7 24.7 - 32.8 pg    MCHC 33.2 32.3 - 35.6 g/dL    RDW 95.6 21.3 - 08.6 %    PLATELETS 254 140 - 440 x10^3/uL    MPV 8.2 7.9 - 10.8 fL    NEUTROPHIL % 61 43 - 77 %    LYMPHOCYTE % 28 16 - 44 %  MONOCYTE % 8 5 - 13 %    EOSINOPHIL % 2 1 - 7 %    BASOPHIL % 1 0 - 1 %    NEUTROPHIL # 4.90 1.85 - 7.80 x10^3/uL    LYMPHOCYTE # 2.30 1.00 - 3.00 x10^3/uL    MONOCYTE # 0.60 0.30 - 1.00 x10^3/uL    EOSINOPHIL # 0.20 0.00 - 0.50 x10^3/uL    BASOPHIL # 0.10 0.00 - 0.10 x10^3/uL   MAGNESIUM   Result Value Ref Range    MAGNESIUM 1.9 1.9 - 2.7 mg/dL   THYROID STIMULATING HORMONE WITH FREE T4 REFLEX   Result Value Ref Range    TSH 2.967 0.450 - 5.330 uIU/mL   C-REACTIVE PROTEIN (CRP)   Result Value Ref Range    C-REACTIVE PROTEIN (CRP) 14.3 (H) 0.1 - 0.5 mg/dL   BLUE TOP TUBE   Result Value Ref Range    RAINBOW/EXTRA TUBE AUTO RESULT Yes    GOLD TOP TUBE   Result Value Ref Range    RAINBOW/EXTRA TUBE AUTO RESULT Yes    C. DIFFICILE PCR    Specimen: Stool   Result Value Ref Range    C. DIFFICILE TOXIN GENE, PCR Negative Negative    PRESUMPTIVE 027/NAP1/BI Negative Not Detected, Negative     No orders to display

## 2023-07-01 DIAGNOSIS — K5730 Diverticulosis of large intestine without perforation or abscess without bleeding: Secondary | ICD-10-CM

## 2023-07-01 DIAGNOSIS — K589 Irritable bowel syndrome without diarrhea: Secondary | ICD-10-CM

## 2023-07-01 LAB — HEPATIC FUNCTION PANEL
ALBUMIN/GLOBULIN RATIO: 1.4 (ref 0.8–1.4)
ALBUMIN: 3.5 g/dL (ref 3.5–5.7)
ALKALINE PHOSPHATASE: 71 U/L (ref 34–104)
ALT (SGPT): 15 U/L (ref 7–52)
AST (SGOT): 16 U/L (ref 13–39)
BILIRUBIN DIRECT: 0.1 md/dL (ref 0.03–0.18)
BILIRUBIN TOTAL: 0.5 mg/dL (ref 0.3–1.0)
BILIRUBIN, INDIRECT: 0.4 mg/dL (ref <=1)
GLOBULIN: 2.5 (ref 2.0–3.5)
PROTEIN TOTAL: 6 g/dL — ABNORMAL LOW (ref 6.4–8.9)

## 2023-07-01 LAB — CBC WITH DIFF
BASOPHIL #: 0.1 10*3/uL (ref 0.00–0.10)
BASOPHIL %: 1 % (ref 0–1)
EOSINOPHIL #: 0.2 10*3/uL (ref 0.00–0.50)
EOSINOPHIL %: 4 % (ref 1–7)
HCT: 37.4 % (ref 31.2–41.9)
HGB: 12.4 g/dL (ref 10.9–14.3)
LYMPHOCYTE #: 1.8 10*3/uL (ref 1.00–3.00)
LYMPHOCYTE %: 31 % (ref 16–44)
MCH: 27.6 pg (ref 24.7–32.8)
MCHC: 33.3 g/dL (ref 32.3–35.6)
MCV: 82.9 fL (ref 75.5–95.3)
MONOCYTE #: 0.5 10*3/uL (ref 0.30–1.00)
MONOCYTE %: 9 % (ref 5–13)
MPV: 8.2 fL (ref 7.9–10.8)
NEUTROPHIL #: 3.3 10*3/uL (ref 1.85–7.80)
NEUTROPHIL %: 56 % (ref 43–77)
PLATELETS: 236 10*3/uL (ref 140–440)
RBC: 4.51 10*6/uL (ref 3.63–4.92)
RDW: 14.3 % (ref 12.3–17.7)
WBC: 5.8 10*3/uL (ref 3.8–11.8)

## 2023-07-01 LAB — BASIC METABOLIC PANEL
ANION GAP: 8 mmol/L (ref 4–13)
BUN/CREA RATIO: 14 (ref 6–22)
BUN: 10 mg/dL (ref 7–25)
CALCIUM: 8.8 mg/dL (ref 8.6–10.3)
CHLORIDE: 109 mmol/L — ABNORMAL HIGH (ref 98–107)
CO2 TOTAL: 21 mmol/L (ref 21–31)
CREATININE: 0.71 mg/dL (ref 0.60–1.30)
ESTIMATED GFR: 93 mL/min/{1.73_m2} (ref >59)
GLUCOSE: 89 mg/dL (ref 74–109)
OSMOLALITY, CALCULATED: 274 mosm/kg (ref 270–290)
POTASSIUM: 3.4 mmol/L — ABNORMAL LOW (ref 3.5–5.1)
SODIUM: 138 mmol/L (ref 136–145)

## 2023-07-01 LAB — MAGNESIUM: MAGNESIUM: 1.8 mg/dL — ABNORMAL LOW (ref 1.9–2.7)

## 2023-07-01 LAB — C-REACTIVE PROTEIN (CRP): C-REACTIVE PROTEIN (CRP): 8.1 mg/dL — ABNORMAL HIGH (ref 0.1–0.5)

## 2023-07-01 MED ORDER — POTASSIUM CHLORIDE ER 20 MEQ TABLET,EXTENDED RELEASE(PART/CRYST)
40.0000 meq | ORAL_TABLET | ORAL | Status: AC
Start: 2023-07-01 — End: 2023-07-01
  Administered 2023-07-01: 40 meq via ORAL
  Filled 2023-07-01: qty 2

## 2023-07-01 NOTE — Progress Notes (Signed)
[facility]    HOSPITALIST PROGRESS NOTE    Alexandra Coleman  Date of service: 07/01/2023  Date of Admission:  06/30/2023  Hospital Day:  LOS: 1 day     Subjective:     Patient was seen and evaluated by the bedside.  She reported progressive improvement in overall clinical status.      Only 1 episode of diarrhea today.      Awaiting GI evaluation.      Vital Signs:  Filed Vitals:    07/01/23 0007 07/01/23 0530 07/01/23 0808 07/01/23 0955   BP: (!) 101/55  (!) 119/55    Pulse: 61  61 56   Resp: 17  16    Temp: 36.6 C (97.9 F)  36.7 C (98.1 F)    SpO2: 94% 93% 93%         Physical Exam:  General:  Patient in NAD, resting in bed, no visitors present  Head:  Normocephalic, atraumatic  Eyes:  PERRL, anicteric sclera  ENT:  Oral mucosa moist, no nasal discharge   Neck:  Soft, supple, trachea midline  Heart:  RRR, S1 and S2 normal  Lungs:  Unlabored respirations. no wheezes, no rales, no conversational dyspnea  Abdomen:  Soft, active bowel sounds, +tender to palpation, non-distended  Extremities:  Pulses equal bilaterally. No edema in lower extremities bilaterally   Skin:  Warm and dry, not diaphoretic.  No ecchymosis noted.   Neuro:  A&O x 3.  No focal deficits.  Speech intact  Psych:  Cooperative, not agitated    Intake & Output:    Intake/Output Summary (Last 24 hours) at 07/01/2023 1132  Last data filed at 07/01/2023 0435  Gross per 24 hour   Intake 189.58 ml   Output --   Net 189.58 ml     I/O current shift:  No intake/output data recorded.  Emesis:    BM:    Date of Last Bowel Movement: 07/01/23  Heme:      acetaminophen (TYLENOL) tablet, 650 mg, Oral, Q4H PRN  enoxaparin PF (LOVENOX) 40 mg/0.4 mL SubQ injection, 40 mg, Subcutaneous, Q24H  ipratropium-albuterol 0.5 mg-3 mg(2.5 mg base)/3 mL Solution for Nebulization, 3 mL, Nebulization, Q4H PRN  metoprolol succinate (TOPROL-XL) 24 hr extended release tablet, 12.5 mg, Oral, Daily  NS premix infusion, , Intravenous,  Continuous  ondansetron (ZOFRAN) 2 mg/mL injection, 4 mg, Intravenous, Q6H PRN  pantoprazole (PROTONIX) delayed release tablet, 40 mg, Oral, Daily  piperacillin-tazobactam (ZOSYN) 4.5 g in NS 100 mL IVPB minibag, 4.5 g, Intravenous, Q8H          Labs:  Recent Results (from the past 48 hour(s))   CBC WITH DIFF    Collection Time: 07/01/23  3:06 AM   Result Value    WBC 5.8    HGB 12.4    HCT 37.4    PLATELETS 236      Results for orders placed or performed during the hospital encounter of 06/30/23 (from the past 48 hour(s))   BASIC METABOLIC PANEL, NON-FASTING    Collection Time: 07/01/23  3:06 AM   Result Value    SODIUM 138    POTASSIUM 3.4 (L)    CHLORIDE 109 (H)    CO2 TOTAL 21    GLUCOSE 89    BUN 10    CREATININE 0.71      Recent Results (from the past 48 hour(s))   HEPATIC FUNCTION PANEL    Collection Time: 07/01/23  3:06  AM   Result Value    ALKALINE PHOSPHATASE 71    ALT (SGPT) 15    AST (SGOT) 16   COMPREHENSIVE METABOLIC PANEL, NON-FASTING    Collection Time: 06/30/23  2:41 PM   Result Value    ALKALINE PHOSPHATASE 91    ALT (SGPT) 16    AST (SGOT) 17      No results found for this or any previous visit (from the past 48 hour(s)).   No results found for this or any previous visit (from the past 48 hour(s)).   No results found for this or any previous visit (from the past 1344 hour(s)).   No results found for this or any previous visit (from the past 48 hour(s)).     Microbiology:  Hospital Encounter on 06/30/23 (from the past 96 hour(s))   C. DIFFICILE PCR    Collection Time: 06/30/23  6:50 PM    Specimen: Stool   Culture Result Status    C. DIFFICILE TOXIN GENE, PCR Negative Final    PRESUMPTIVE 027/NAP1/BI Negative Final       Imaging:   CT ABDOMEN PELVIS W IV CONTRAST  Narrative: Tennis Must Mccuen    RADIOLOGIST: Mickey Farber, MD    CT ABDOMEN PELVIS W IV CONTRAST performed on 06/30/2023 1:22 PM    CLINICAL HISTORY: K57.30: Diverticulosis of colon without diverticulitis.  LLQ ABD PAIN, DIARRHEA, H/O  Diverticulosis of colon without diverticulitis, CHOLECYSTECTOMY, APPENDECTOMY    TECHNIQUE:  Abdomen and pelvis CT with intravenous contrast.  IV CONTRAST: 75 ml's of Omnipaque 350    COMPARISON:  04/06/2018    FINDINGS:  Lung bases: Clear. There is a small sliding hiatal hernia    Liver:   There is some fatty infiltration    Gallbladder:   Surgically absent.    Spleen:   Unremarkable.    Pancreas:   Unremarkable.    Adrenals:   Unremarkable.    Kidneys:   Unremarkable.    Bladder:  Decompressed    Uterus and Adnexa:  Unremarkable.    Bowel:   There is some liquid stool in the colon. There is diverticulosis of the distal descending and sigmoid colon with some inflammatory changes adjacent to the junction of the distal descending and proximal sigmoid colon  There is some mural thickening of the stomach and duodenal bulb as well as the proximal duodenum    Appendix:  Absent    Lymph nodes:  No suspicious lymph node enlargement.    Vasculature:   Major vascular structures are unremarkable.     Peritoneum / Retroperitoneum: No ascites.  No free air.    Bones:   Degenerative changes of the spine.  Impression: 1. Diverticulosis. There are inflammatory changes at the junction of the distal descending and proximal sigmoid colon that may represent mild diverticulitis. Epiploic appendagitis could have a similar appearance . There is no associated drainable abscess or pneumoperitoneum  2. Mural thickening of the stomach and duodenum may be postinflammatory, suggesting gastritis and duodenitis    One or more dose reduction techniques were used (e.g., Automated exposure control, adjustment of the mA and/or kV according to patient size, use of iterative reconstruction technique).    A Significant Orange actionable finding has been sent via the Power[employer] Actionable Findings application on 06/30/2023 1:31 PM, Message ID [id number]. Receipt of this communication will be communicated to [facility] or responsible  provider and will be documented in [employer] Actionable Findings System upon receiving the acknowledgement.  Radiologist location ID: [id number]        Assessment/ Plan:   Active Hospital Problems   (*Primary Problem)    Diagnosis    *Diverticulitis    Hyperlipidemia    Gastroesophageal reflux disease without esophagitis    Irritable bowel syndrome with diarrhea    Fibromyalgia    Mild intermittent asthma without complication    PVC's (premature ventricular contractions)     This is a 68 year old female with a PMH of asthma, esophageal reflux, hypertension, HLD, history of PVCs, and irritable bowel syndrome who presented to the ER with complaints of abdominal pain, diarrhea and an abnormal CT scan.     Patient reported that she has been having diarrhea for the last 4 weeks associated with left lower quadrant abdominal pain.  She presented to her PCP on 01/22 and was prescribed p.o. Augmentin and CT abdomen/pelvis was ordered.    CT abdomen/pelvis showed findings suggestive of mild diverticulitis and her PCP asked her to present to the ER.     Patient reported a past history of diverticulitis about 10 years ago treated with ciprofloxacin and Flagyl, which was eventually was complicated by C diff infection.        CT abdomen/pelvis:   1.Diverticulosis. There are inflammatory changes at the junction of the distal descending and proximal sigmoid colon that may represent mild diverticulitis. Epiploic appendagitis could have a similar appearance . There is no associated drainable abscess or pneumoperitoneum  2.Mural thickening of the stomach and duodenum may be postinflammatory, suggesting gastritis and duodenitis         Abdominal pain   Persistent diarrhea   Mild diverticulitis  -IV Zosyn   -C diff negative.  Follow up stool BioFire   -GI consulted for gastric and duodenal thickening  -tolerating clear liquid diet.  Advanced to general diet.  -Monitor hemodynamics closely              Comorbidities   hypertension    HLD   Esophageal reflux   History of PVCs  Asthma      -home meds as ordered      Nutrition:    DIET CLEAR LIQUID Do you want to initiate MNT Protocol? Yes    Additional clinical characteristics related to nutrition:    - monitor for weight changes   - monitor intake and output    - monitor bowel functions                Disposition Planning:  Home    Delight Ovens, MD  07/01/2023  [facility] MEDICINE HOSPITALIST      This note was partially created using voice recognition software and is inherently subject to errors including those of syntax and "sound alike " substitutions which may escape proof reading. In such instances, original meaning may be extrapolated by contextual derivation.

## 2023-07-01 NOTE — Consults (Signed)
[facility]  Gastroenterology/ Hepatology Consult Note    Alexandra, Coleman, 68 y.o. female  Encounter Start Date:  06/30/2023  Inpatient Admission Date: 06/30/2023   Date of Birth:  [birth year]    Date of Service: 07/01/23     Referring physician:  No ref. provider found    Chief Complaint: abdominal pain, diarrhea    Alexandra Coleman is a 68 y.o., White female who presents with abdominal pain and diarrhea. She denies any restaurant food. She was on Keflex 8 weeks ago for UTI. CDT is negative. GI Biofire is still pending.  CT indicated mild diverticulitis. WBC normal.     She indicates that her symptoms have improved and would like her diet advanced.        REVIEW OF SYSTEMS:    Review of Systems was completed with pertinent ROS as addressed in HPI.            Medications Prior to Admission       Prescriptions    amoxicillin-pot clavulanate (AUGMENTIN) 875-125 mg Oral Tablet    Take 1 Tablet by mouth Three times a day    fluorouraciL (EFUDEX) 5 % Cream    APPLY TWO TIMES A DAY TO SKIN CANCER X 4 WEEKS. WASH OFF IN AM.    metoprolol succinate (TOPROL-XL) 25 mg Oral Tablet Sustained Release 24 hr    TAKE 1/2 TABLET BY MOUTH EVERY DAY    pantoprazole (PROTONIX) 40 mg Oral Tablet, Delayed Release (E.C.)    TAKE 1 TABLET BY MOUTH ONCE A DAY    spironolactone (ALDACTONE) 50 mg Oral Tablet    TAKE 1 TABLET (50 MG TOTAL) BY MOUTH EVERY MORNING BEFORE BREAKFAST             Allergies:    Allergies   Allergen Reactions    Latex Rash    Ciprofloxacin     Codeine     Diphenhydramine Hcl     Levofloxacin     Meloxicam  Other Adverse Reaction (Add comment)     Rapid heart rate         ED medications:   Medications Administered in the ED   morphine 2 mg/mL injection (has no administration in time range)   ondansetron (ZOFRAN) 2 mg/mL injection (has no administration in time range)         Home Meds:      Prior to Admission medications    Medication Sig Start Date End  Date Taking? Authorizing Provider   amoxicillin-pot clavulanate (AUGMENTIN) 875-125 mg Oral Tablet Take 1 Tablet by mouth Three times a day 06/29/23 07/10/23 Yes Provider, Historical   fluorouraciL (EFUDEX) 5 % Cream APPLY TWO TIMES A DAY TO SKIN CANCER X 4 WEEKS. WASH OFF IN AM.   Yes Provider, Historical   metoprolol succinate (TOPROL-XL) 25 mg Oral Tablet Sustained Release 24 hr TAKE 1/2 TABLET BY MOUTH EVERY DAY 11/16/22  Yes Huffman, Emilee J, APRN,FNP-BC   pantoprazole (PROTONIX) 40 mg Oral Tablet, Delayed Release (E.C.) TAKE 1 TABLET BY MOUTH ONCE A DAY 12/10/22  Yes Virgie Dad, FNP-BC   spironolactone (ALDACTONE) 50 mg Oral Tablet TAKE 1 TABLET (50 MG TOTAL) BY MOUTH EVERY MORNING BEFORE BREAKFAST 12/10/22  Yes Virgie Dad, FNP-BC   famotidine (PEPCID) 20 mg Oral Tablet Take 1 Tablet (20 mg total) by mouth Every evening  Patient not taking: Reported on 08/02/2022 05/05/22 06/30/23  Virgie Dad, FNP-BC   ubidecarenone (H2Q COQ10 ORAL) Take 1 Capsule by mouth  Twice daily  06/30/23  Provider, Historical          Inpatient Meds:      acetaminophen (TYLENOL) tablet, 650 mg, Oral, Q4H PRN  enoxaparin PF (LOVENOX) 40 mg/0.4 mL SubQ injection, 40 mg, Subcutaneous, Q24H  ipratropium-albuterol 0.5 mg-3 mg(2.5 mg base)/3 mL Solution for Nebulization, 3 mL, Nebulization, Q4H PRN  metoprolol succinate (TOPROL-XL) 24 hr extended release tablet, 12.5 mg, Oral, Daily  ondansetron (ZOFRAN) 2 mg/mL injection, 4 mg, Intravenous, Q6H PRN  pantoprazole (PROTONIX) delayed release tablet, 40 mg, Oral, Daily  piperacillin-tazobactam (ZOSYN) 4.5 g in NS 100 mL IVPB minibag, 4.5 g, Intravenous, Q8H             PMHx:    Past Medical History:   Diagnosis Date    Asthma     Dyspnea     Edema     Esophageal reflux     History of skin cancer     Hypertension     IBS (irritable bowel syndrome)     Palpitations     PVC (premature ventricular contraction)         PSHx:   Past Surgical History:   Procedure Laterality Date    BASAL CELL  CARCINOMA EXCISION      CARDIAC CATHETERIZATION  07/2018    [facility]  [place]    HX APPENDECTOMY      HX CHOLECYSTECTOMY      HX COLONOSCOPY            Family History  Family Medical History:       Problem Relation (Age of Onset)    Cancer Mother    Diabetes Sister    Diabetes type II Mother    Heart Disease Mother, Father    Hypertension (High Blood Pressure) Mother, Sister    Melanoma Sister    Thyroid Disease Mother, Sister           Social History  Social History     Tobacco Use    Smoking status: Never    Smokeless tobacco: Never   Vaping Use    Vaping status: Never Used   Substance Use Topics    Alcohol use: Not Currently    Drug use: Never             Previous GI Procedures:  * No surgery found *      Vital Signs:  Temp (24hrs) Max:36.9 C (98.4 F)      Systolic (24hrs), Avg:129 , Min:101 , Max:152     Diastolic (24hrs), Avg:61, Min:49, Max:76    Temp  Avg: 36.7 C (98.1 F)  Min: 36.5 C (97.7 F)  Max: 36.9 C (98.4 F)  MAP (Non-Invasive)  Avg: 80.5 mmHG  Min: 69 mmHG  Max: 93 mmHG  Pulse  Avg: 59.5  Min: 54  Max: 76  Resp  Avg: 17.2  Min: 16  Max: 18  SpO2  Avg: 97 %  Min: 93 %  Max: 100 %           PHYSICAL EXAM:   Temperature: 36.8 C (98.3 F)  Heart Rate: 55  BP (Non-Invasive): (!) 119/57  Respiratory Rate: 16  SpO2: 96 %  Vital signs reviewed  General  No acute distress  Skin color normal  HEENT  Normal, no icterus, neck supple  Lungs clear bilaterally  Card RRR,   Abdomen  BS active, no tenderness, no guarding, no distension,   Ext  no edema  RELEVANT LABS:  Labs:    Results for orders placed or performed during the hospital encounter of 06/30/23 (from the past 24 hour(s))   COMPREHENSIVE METABOLIC PANEL, NON-FASTING   Result Value Ref Range    SODIUM 137 136 - 145 mmol/L    POTASSIUM 4.0 3.5 - 5.1 mmol/L    CHLORIDE 103 98 - 107 mmol/L    CO2 TOTAL 25 21 - 31 mmol/L    ANION GAP 9 4 - 13 mmol/L    BUN 10 7 - 25 mg/dL    CREATININE 2.13 0.86 - 1.30 mg/dL    BUN/CREA RATIO 13 6 - 22     ESTIMATED GFR 85 >59 mL/min/1.51m^2    ALBUMIN 4.4 3.5 - 5.7 g/dL    CALCIUM 57.8 8.6 - 46.9 mg/dL    GLUCOSE 93 74 - 629 mg/dL    ALKALINE PHOSPHATASE 91 34 - 104 U/L    ALT (SGPT) 16 7 - 52 U/L    AST (SGOT) 17 13 - 39 U/L    BILIRUBIN TOTAL 0.5 0.3 - 1.0 mg/dL    PROTEIN TOTAL 7.7 6.4 - 8.9 g/dL    ALBUMIN/GLOBULIN RATIO 1.3 0.8 - 1.4    OSMOLALITY, CALCULATED 273 270 - 290 mOsm/kg    CALCIUM, CORRECTED 10.0 8.9 - 10.8 mg/dL    GLOBULIN 3.3 2.0 - 3.5   LACTIC ACID LEVEL W/ REFLEX FOR LEVEL >2.0   Result Value Ref Range    LACTIC ACID 1.1 0.5 - 2.2 mmol/L   CBC WITH DIFF   Result Value Ref Range    WBC 8.0 3.8 - 11.8 x10^3/uL    RBC 5.17 (H) 3.63 - 4.92 x10^6/uL    HGB 14.3 10.9 - 14.3 g/dL    HCT 52.8 (H) 41.3 - 41.9 %    MCV 83.3 75.5 - 95.3 fL    MCH 27.7 24.7 - 32.8 pg    MCHC 33.2 32.3 - 35.6 g/dL    RDW 24.4 01.0 - 27.2 %    PLATELETS 254 140 - 440 x10^3/uL    MPV 8.2 7.9 - 10.8 fL    NEUTROPHIL % 61 43 - 77 %    LYMPHOCYTE % 28 16 - 44 %    MONOCYTE % 8 5 - 13 %    EOSINOPHIL % 2 1 - 7 %    BASOPHIL % 1 0 - 1 %    NEUTROPHIL # 4.90 1.85 - 7.80 x10^3/uL    LYMPHOCYTE # 2.30 1.00 - 3.00 x10^3/uL    MONOCYTE # 0.60 0.30 - 1.00 x10^3/uL    EOSINOPHIL # 0.20 0.00 - 0.50 x10^3/uL    BASOPHIL # 0.10 0.00 - 0.10 x10^3/uL   MAGNESIUM   Result Value Ref Range    MAGNESIUM 1.9 1.9 - 2.7 mg/dL   THYROID STIMULATING HORMONE WITH FREE T4 REFLEX   Result Value Ref Range    TSH 2.967 0.450 - 5.330 uIU/mL   C-REACTIVE PROTEIN (CRP)   Result Value Ref Range    C-REACTIVE PROTEIN (CRP) 14.3 (H) 0.1 - 0.5 mg/dL   BLUE TOP TUBE   Result Value Ref Range    RAINBOW/EXTRA TUBE AUTO RESULT Yes    GOLD TOP TUBE   Result Value Ref Range    RAINBOW/EXTRA TUBE AUTO RESULT Yes    C. DIFFICILE PCR    Specimen: Stool   Result Value Ref Range    C. DIFFICILE TOXIN GENE, PCR Negative Negative    PRESUMPTIVE 027/NAP1/BI Negative Not  Detected, Negative   BASIC METABOLIC PANEL, NON-FASTING   Result Value Ref Range    SODIUM 138 136 - 145  mmol/L    POTASSIUM 3.4 (L) 3.5 - 5.1 mmol/L    CHLORIDE 109 (H) 98 - 107 mmol/L    CO2 TOTAL 21 21 - 31 mmol/L    ANION GAP 8 4 - 13 mmol/L    CALCIUM 8.8 8.6 - 10.3 mg/dL    GLUCOSE 89 74 - 086 mg/dL    BUN 10 7 - 25 mg/dL    CREATININE 5.78 4.69 - 1.30 mg/dL    BUN/CREA RATIO 14 6 - 22    ESTIMATED GFR 93 >59 mL/min/1.66m^2    OSMOLALITY, CALCULATED 274 270 - 290 mOsm/kg   MAGNESIUM   Result Value Ref Range    MAGNESIUM 1.8 (L) 1.9 - 2.7 mg/dL   HEPATIC FUNCTION PANEL   Result Value Ref Range    ALBUMIN 3.5 3.5 - 5.7 g/dL    ALKALINE PHOSPHATASE 71 34 - 104 U/L    ALT (SGPT) 15 7 - 52 U/L    AST (SGOT) 16 13 - 39 U/L    BILIRUBIN TOTAL 0.5 0.3 - 1.0 mg/dL    BILIRUBIN, INDIRECT 0.4 <=1 mg/dL    PROTEIN TOTAL 6.0 (L) 6.4 - 8.9 g/dL    GLOBULIN 2.5 2.0 - 3.5    ALBUMIN/GLOBULIN RATIO 1.4 0.8 - 1.4    BILIRUBIN DIRECT 0.10 0.03 - 0.18 md/dL   C-REACTIVE PROTEIN (CRP)   Result Value Ref Range    C-REACTIVE PROTEIN (CRP) 8.1 (H) 0.1 - 0.5 mg/dL   CBC WITH DIFF   Result Value Ref Range    WBC 5.8 3.8 - 11.8 x10^3/uL    RBC 4.51 3.63 - 4.92 x10^6/uL    HGB 12.4 10.9 - 14.3 g/dL    HCT 62.9 52.8 - 41.3 %    MCV 82.9 75.5 - 95.3 fL    MCH 27.6 24.7 - 32.8 pg    MCHC 33.3 32.3 - 35.6 g/dL    RDW 24.4 01.0 - 27.2 %    PLATELETS 236 140 - 440 x10^3/uL    MPV 8.2 7.9 - 10.8 fL    NEUTROPHIL % 56 43 - 77 %    LYMPHOCYTE % 31 16 - 44 %    MONOCYTE % 9 5 - 13 %    EOSINOPHIL % 4 1 - 7 %    BASOPHIL % 1 0 - 1 %    NEUTROPHIL # 3.30 1.85 - 7.80 x10^3/uL    LYMPHOCYTE # 1.80 1.00 - 3.00 x10^3/uL    MONOCYTE # 0.50 0.30 - 1.00 x10^3/uL    EOSINOPHIL # 0.20 0.00 - 0.50 x10^3/uL    BASOPHIL # 0.10 0.00 - 0.10 x10^3/uL          No results found for: "CARCIEMBAG", "MQAD24", "CAAGGICA199", "CA199"   CBC  Diff   Lab Results   Component Value Date/Time    WBC 5.8 07/01/2023 03:06 AM    HGB 12.4 07/01/2023 03:06 AM    HCT 37.4 07/01/2023 03:06 AM    PLTCNT 236 07/01/2023 03:06 AM    RBC 4.51 07/01/2023 03:06 AM    MCV 82.9 07/01/2023 03:06  AM    MCHC 33.3 07/01/2023 03:06 AM    MCH 27.6 07/01/2023 03:06 AM    RDW 14.3 07/01/2023 03:06 AM    MPV 8.2 07/01/2023 03:06 AM    Lab Results   Component Value Date/Time  PMNS 56 07/01/2023 03:06 AM    LYMPHOCYTES 31 07/01/2023 03:06 AM    EOSINOPHIL 4 07/01/2023 03:06 AM    MONOCYTES 9 07/01/2023 03:06 AM    BASOPHILS 1 07/01/2023 03:06 AM    BASOPHILS 0.10 07/01/2023 03:06 AM    PMNABS 3.30 07/01/2023 03:06 AM    LYMPHSABS 1.80 07/01/2023 03:06 AM    EOSABS 0.20 07/01/2023 03:06 AM    MONOSABS 0.50 07/01/2023 03:06 AM           No results found for: "CARCIEMBAG" @LASTIFOB @    Imaging Studies:  No results found for this or any previous visit (from the past 161096045 hour(s)).  No results found for this or any previous visit (from the past 409811914 hour(s)).  Recent Results (from the past 782956213 hour(s))   CT ABDOMEN PELVIS W IV CONTRAST    Collection Time: 06/30/23  1:22 PM    Narrative    Tennis Must [place]    RADIOLOGIST: Mickey Farber, MD    CT ABDOMEN PELVIS W IV CONTRAST performed on 06/30/2023 1:22 PM    CLINICAL HISTORY: K57.30: Diverticulosis of colon without diverticulitis.  LLQ ABD PAIN, DIARRHEA, H/O Diverticulosis of colon without diverticulitis, CHOLECYSTECTOMY, APPENDECTOMY    TECHNIQUE:  Abdomen and pelvis CT with intravenous contrast.  IV CONTRAST: 75 ml's of Omnipaque 350    COMPARISON:  04/06/2018    FINDINGS:  Lung bases: Clear. There is a small sliding hiatal hernia    Liver:   There is some fatty infiltration    Gallbladder:   Surgically absent.    Spleen:   Unremarkable.    Pancreas:   Unremarkable.    Adrenals:   Unremarkable.    Kidneys:   Unremarkable.    Bladder:  Decompressed    Uterus and Adnexa:  Unremarkable.    Bowel:   There is some liquid stool in the colon. There is diverticulosis of the distal descending and sigmoid colon with some inflammatory changes adjacent to the junction of the distal descending and proximal sigmoid colon  There is some mural thickening of the stomach  and duodenal bulb as well as the proximal duodenum    Appendix:  Absent    Lymph nodes:  No suspicious lymph node enlargement.    Vasculature:   Major vascular structures are unremarkable.     Peritoneum / Retroperitoneum: No ascites.  No free air.    Bones:   Degenerative changes of the spine.        Impression    1. Diverticulosis. There are inflammatory changes at the junction of the distal descending and proximal sigmoid colon that may represent mild diverticulitis. Epiploic appendagitis could have a similar appearance . There is no associated drainable abscess or pneumoperitoneum  2. Mural thickening of the stomach and duodenum may be postinflammatory, suggesting gastritis and duodenitis      One or more dose reduction techniques were used (e.g., Automated exposure control, adjustment of the mA and/or kV according to patient size, use of iterative reconstruction technique).    A Significant Orange actionable finding has been sent via the [employer] Actionable Findings application on 06/30/2023 1:31 PM, Message ID [id number]. Receipt of this communication will be communicated to [facility] or responsible provider and will be documented in [employer] Actionable Findings System upon receiving the acknowledgement.      Radiologist location ID: [id number]       No results found for this or any previous visit (from the past 440102725 hour(s)).  No  results found for this or any previous visit (from the past 956387564 hour(s)).  No results found for this or any previous visit (from the past 332951884 hour(s)).    Assessment/Plan and Recommendations:    Diverticulitis    Observe from GI standpoint. Will advance diet to bland. Will plan outpatient EGD/Colonoscopy in the near future. She will need to follow up in our office once discharged.     Discussed with Dr.K.Patel and treatment by him.      Ulyess Blossom, NP-C

## 2023-07-01 NOTE — Care Plan (Signed)
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

## 2023-07-01 NOTE — Nurses Notes (Signed)
Patient bradycardic on telemetry, HR as low as 44. Patient received PO metoprolol this AM. Dr. Matilde Bash notified.

## 2023-07-02 DIAGNOSIS — K580 Irritable bowel syndrome with diarrhea: Secondary | ICD-10-CM

## 2023-07-02 DIAGNOSIS — R001 Bradycardia, unspecified: Secondary | ICD-10-CM

## 2023-07-02 DIAGNOSIS — I493 Ventricular premature depolarization: Secondary | ICD-10-CM

## 2023-07-02 DIAGNOSIS — J4520 Mild intermittent asthma, uncomplicated: Secondary | ICD-10-CM

## 2023-07-02 DIAGNOSIS — M797 Fibromyalgia: Secondary | ICD-10-CM

## 2023-07-02 LAB — CBC
HCT: 37.7 % (ref 31.2–41.9)
HGB: 12.6 g/dL (ref 10.9–14.3)
MCH: 27.5 pg (ref 24.7–32.8)
MCHC: 33.4 g/dL (ref 32.3–35.6)
MCV: 82.4 fL (ref 75.5–95.3)
MPV: 8.1 fL (ref 7.9–10.8)
PLATELETS: 232 10*3/uL (ref 140–440)
RBC: 4.58 10*6/uL (ref 3.63–4.92)
RDW: 14.3 % (ref 12.3–17.7)
WBC: 5.1 10*3/uL (ref 3.8–11.8)

## 2023-07-02 LAB — MAGNESIUM: MAGNESIUM: 1.8 mg/dL — ABNORMAL LOW (ref 1.9–2.7)

## 2023-07-02 LAB — GI PANEL BY BIOFIRE FILM ARRAY
ADENOVIRUS F 40/41: NOT DETECTED
ASTROVIRUS: NOT DETECTED
CAMPYLOBACTER: NOT DETECTED
CRYPTOSPORIDIUM: NOT DETECTED
CYCLOSPORA CAYETANENSIS: NOT DETECTED
ENTAMOEBA HISTOLYTICA: NOT DETECTED
ENTEROAGGREGATIVE E. COLI (EAEC): NOT DETECTED
ENTEROPATHOGENIC E COLI (EPEC): NOT DETECTED
ENTEROTOXIGENIC E COLI (ETEC) LT/ST: NOT DETECTED
GIARDIA LAMBLIA: NOT DETECTED
NOROVIRUS GI/GII: NOT DETECTED
PLESIOMONAS SHIGELLOIDES: NOT DETECTED
ROTAVIRUS A: NOT DETECTED
SALMONELLA SPECIES: NOT DETECTED
SAPOVIRUS: NOT DETECTED
SHIGA-LIKE TOXIN-PRODUCING E COLI (STEC) STX1/STX2: NOT DETECTED
SHIGELLA/ENTEROINVASIVE E COLI (EIEC): NOT DETECTED
VIBRIO CHOLERAE: NOT DETECTED
VIBRIO: NOT DETECTED
YERSINIA ENTEROCOLITICA: NOT DETECTED

## 2023-07-02 LAB — BASIC METABOLIC PANEL
ANION GAP: 11 mmol/L (ref 4–13)
BUN/CREA RATIO: 10 (ref 6–22)
BUN: 8 mg/dL (ref 7–25)
CALCIUM: 8.8 mg/dL (ref 8.6–10.3)
CHLORIDE: 109 mmol/L — ABNORMAL HIGH (ref 98–107)
CO2 TOTAL: 21 mmol/L (ref 21–31)
CREATININE: 0.83 mg/dL (ref 0.60–1.30)
ESTIMATED GFR: 77 mL/min/{1.73_m2} (ref >59)
GLUCOSE: 109 mg/dL (ref 74–109)
OSMOLALITY, CALCULATED: 280 mosm/kg (ref 270–290)
POTASSIUM: 3.5 mmol/L (ref 3.5–5.1)
SODIUM: 141 mmol/L (ref 136–145)

## 2023-07-02 MED ORDER — AMOXICILLIN 875 MG-POTASSIUM CLAVULANATE 125 MG TABLET
1.0000 | ORAL_TABLET | Freq: Two times a day (BID) | ORAL | 0 refills | Status: AC
Start: 2023-07-02 — End: 2023-07-06

## 2023-07-02 MED ORDER — LOPERAMIDE 2 MG CAPSULE
2.0000 mg | ORAL_CAPSULE | Freq: Four times a day (QID) | ORAL | 0 refills | Status: AC | PRN
Start: 2023-07-02 — End: ?

## 2023-07-02 MED ORDER — LOPERAMIDE 2 MG CAPSULE
4.0000 mg | ORAL_CAPSULE | Freq: Three times a day (TID) | ORAL | Status: DC
Start: 2023-07-02 — End: 2023-07-02
  Administered 2023-07-02: 4 mg via ORAL
  Filled 2023-07-02: qty 2

## 2023-07-02 MED ORDER — LOPERAMIDE 2 MG CAPSULE
2.0000 mg | ORAL_CAPSULE | ORAL | Status: DC | PRN
Start: 2023-07-04 — End: 2023-07-02

## 2023-07-02 NOTE — Consults (Signed)
[facility]  Gastroenterology/ Hepatology Consult Note      Patient: Alexandra Coleman, Alexandra Coleman, 68 y.o. female  Date of Birth: 07/20/1955  Admission Date: 06/30/2023  PCP: No Pcp    History of Present Illness:    Resting in bed. Still has LLQ pain but states improved since admission. She denies nausea, vomiting, heartburn or acid reflux. Continues to have diarrhea. She has had 7 liquid stools so far today. Denies blood in stools or melena.     Review of Systems   Constitutional: Negative for fever.     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 WASH OFF IN AM.    metoprolol succinate (TOPROL-XL) 25 mg Oral Tablet Sustained Release 24 hr    TAKE 1/2 TABLET BY MOUTH EVERY DAY    pantoprazole (PROTONIX) 40 mg Oral Tablet, Delayed Release (E.C.)    TAKE 1 TABLET BY MOUTH ONCE A DAY    spironolactone (ALDACTONE) 50 mg Oral Tablet    TAKE 1 TABLET (50 MG TOTAL) BY MOUTH EVERY MORNING BEFORE BREAKFAST          Allergies   Allergen Reactions    Latex Rash    Ciprofloxacin     Codeine     Diphenhydramine Hcl     Levofloxacin     Meloxicam  Other Adverse Reaction (Add comment)     Rapid heart rate          Vitals:  Temperature: 36 C (96.8 F)  Heart Rate: 53  Respiratory Rate: 16  BP (Non-Invasive): 136/72  SpO2: 96 %    Objective:  General Appearance:  Comfortable, well-appearing and in no acute distress.    Vital signs: (most recent): Blood pressure 136/72, pulse 53, temperature 36 C (96.8 F), resp. rate 16, height 1.626 m (5\' 4" ), weight 80.6 kg (177 lb 9.6 oz), SpO2 96%.  Vital signs are normal.  No fever.    Lungs:  Normal effort and normal respiratory rate.  Breath sounds clear to auscultation.    Heart: Normal rate.  Regular rhythm.  S1 normal and S2 normal.    Abdomen:  Abdomen is soft.  Bowel sounds are normal.   There is left lower quadrant tenderness.    Extremities: Normal range of motion.    Pulses: Distal pulses are intact.    Neurological: Patient is alert and oriented to person, place and time.    Skin:  Warm and dry.        Active Hospital Problems   (*Primary Problem)    Diagnosis    *Diverticulitis    Hyperlipidemia    Gastroesophageal reflux disease without esophagitis    Irritable bowel syndrome with diarrhea    Fibromyalgia    Mild intermittent asthma without complication    PVC's (premature ventricular contractions)       Labs:     Results for orders placed or performed during the hospital encounter of 06/30/23 (from the past 24 hour(s))   CBC - AM ONCE   Result Value Ref Range    WBC 5.1 3.8 - 11.8 x10^3/uL    RBC 4.58 3.63 - 4.92 x10^6/uL    HGB 12.6 10.9 -  14.3 g/dL    HCT 16.1 09.6 - 04.5 %    MCV 82.4 75.5 - 95.3 fL    MCH 27.5 24.7 - 32.8 pg    MCHC 33.4 32.3 - 35.6 g/dL    RDW 40.9 81.1 - 91.4 %    PLATELETS 232 140 - 440 x10^3/uL    MPV 8.1 7.9 - 10.8 fL   BASIC METABOLIC PANEL - AM ONCE   Result Value Ref Range    SODIUM 141 136 - 145 mmol/L    POTASSIUM 3.5 3.5 - 5.1 mmol/L    CHLORIDE 109 (H) 98 - 107 mmol/L    CO2 TOTAL 21 21 - 31 mmol/L    ANION GAP 11 4 - 13 mmol/L    CALCIUM 8.8 8.6 - 10.3 mg/dL    GLUCOSE 782 74 - 956 mg/dL    BUN 8 7 - 25 mg/dL    CREATININE 2.13 0.86 - 1.30 mg/dL    BUN/CREA RATIO 10 6 - 22    ESTIMATED GFR 77 >59 mL/min/1.61m^2    OSMOLALITY, CALCULATED 280 270 - 290 mOsm/kg   MAGNESIUM - AM ONCE   Result Value Ref Range    MAGNESIUM 1.8 (L) 1.9 - 2.7 mg/dL       Imaging Studies:               Assessment/Plan:  Diverticulitis    Continue current management and supportive care. Continue antibiotics. Observe from GI standpoint. Bland diet. Will plan outpt EGD/colonoscopy in the near future.  Will continue to follow. Further recommendations based on clinical course. Patient discussed in detail with Dr Concha Se and treatment plan  decided by him.     Norina Buzzard [place], [place]

## 2023-07-02 NOTE — Nurses Notes (Signed)
Discharge order received, instructions given to patient. IV removed with catheter intact. Patient alert and oriented at this time. Patient VSS. Patient ambulated self off unit at this time.

## 2023-07-02 NOTE — Progress Notes (Signed)
[place] [facility]IC[facility]    HOSPITALIST PROGRESS NOTE    Alexandra Coleman  Date of service: 07/02/2023  Date of Admission:  06/30/2023  Hospital Day:  LOS: 2 days     Subjective:     Patient was seen and evaluated by the bedside.  Episodes of bradycardia reported.  Metoprolol held.      She continues to report several episodes of loose stools.  However, stool consistency is no longer watery.  C diff and stool BioFire negative.  We will start p.r.n. Imodium.      For possible discharge tomorrow, 01/26.    Vital Signs:  Filed Vitals:    07/01/23 2206 07/02/23 0036 07/02/23 0408 07/02/23 0715   BP:  (!) 120/53 (!) 118/57 (!) 122/56   Pulse: 52 54 (!) 43 53   Resp:  16 16 17    Temp:  36.5 C (97.7 F) 36.7 C (98.1 F) 36.3 C (97.3 F)   SpO2:  94% 94% 93%        Physical Exam:  General:  Patient in NAD, resting in bed, no visitors present  Head:  Normocephalic, atraumatic  Eyes:  PERRL, anicteric sclera  ENT:  Oral mucosa moist, no nasal discharge   Neck:  Soft, supple, trachea midline  Heart:  RRR, S1 and S2 normal  Lungs:  Unlabored respirations. no wheezes, no rales, no conversational dyspnea  Abdomen:  Soft, active bowel sounds, +tender to palpation, non-distended  Extremities:  Pulses equal bilaterally. No edema in lower extremities bilaterally   Skin:  Warm and dry, not diaphoretic.  No ecchymosis noted.   Neuro:  A&O x 3.  No focal deficits.  Speech intact  Psych:  Cooperative, not agitated    Intake & Output:    Intake/Output Summary (Last 24 hours) at 07/02/2023 1030  Last data filed at 07/02/2023 0954  Gross per 24 hour   Intake 450.42 ml   Output --   Net 450.42 ml     I/O current shift:  01/25 0700 - 01/25 1859  In: 240 [P.O.:240]  Out: -   Emesis:    BM:    Date of Last Bowel Movement: 07/01/23  Heme:      acetaminophen (TYLENOL) tablet, 650 mg, Oral, Q4H PRN  enoxaparin PF (LOVENOX) 40 mg/0.4 mL SubQ injection, 40 mg, Subcutaneous, Q24H  ipratropium-albuterol 0.5 mg-3 mg(2.5 mg  base)/3 mL Solution for Nebulization, 3 mL, Nebulization, Q4H PRN  loperamide (IMODIUM) capsule, 4 mg, Oral, 3x/day  [START ON 07/04/2023] loperamide (IMODIUM) capsule, 2 mg, Oral, Q4H PRN  [Held by provider] metoprolol succinate (TOPROL-XL) 24 hr extended release tablet, 12.5 mg, Oral, Daily  ondansetron (ZOFRAN) 2 mg/mL injection, 4 mg, Intravenous, Q6H PRN  pantoprazole (PROTONIX) delayed release tablet, 40 mg, Oral, Daily  piperacillin-tazobactam (ZOSYN) 4.5 g in NS 100 mL IVPB minibag, 4.5 g, Intravenous, Q8H          Labs:  Recent Results (from the past 48 hour(s))   CBC WITH DIFF    Collection Time: 07/01/23  3:06 AM   Result Value    WBC 5.8    HGB 12.4    HCT 37.4    PLATELETS 236      Results for orders placed or performed during the hospital encounter of 06/30/23 (from the past 48 hour(s))   BASIC METABOLIC PANEL - AM ONCE    Collection Time: 07/02/23  3:29 AM   Result Value    SODIUM 141  POTASSIUM 3.5    CHLORIDE 109 (H)    CO2 TOTAL 21    GLUCOSE 109    BUN 8    CREATININE 0.83      Recent Results (from the past 48 hour(s))   HEPATIC FUNCTION PANEL    Collection Time: 07/01/23  3:06 AM   Result Value    ALKALINE PHOSPHATASE 71    ALT (SGPT) 15    AST (SGOT) 16   COMPREHENSIVE METABOLIC PANEL, NON-FASTING    Collection Time: 06/30/23  2:41 PM   Result Value    ALKALINE PHOSPHATASE 91    ALT (SGPT) 16    AST (SGOT) 17      No results found for this or any previous visit (from the past 48 hour(s)).   No results found for this or any previous visit (from the past 48 hour(s)).   No results found for this or any previous visit (from the past 1344 hour(s)).   No results found for this or any previous visit (from the past 48 hour(s)).     Microbiology:  Hospital Encounter on 06/30/23 (from the past 96 hour(s))   ADULT ROUTINE BLOOD CULTURE, SET OF 2 ADULT BOTTLES (BACTERIA AND YEAST)    Collection Time: 06/30/23  2:40 PM    Specimen: Blood   Culture Result Status    BLOOD CULTURE, ROUTINE No Growth 18-24 hrs.  Preliminary   ADULT ROUTINE BLOOD CULTURE, SET OF 2 ADULT BOTTLES (BACTERIA AND YEAST)    Collection Time: 06/30/23  2:41 PM    Specimen: Blood   Culture Result Status    BLOOD CULTURE, ROUTINE No Growth 18-24 hrs. Preliminary   C. DIFFICILE PCR    Collection Time: 06/30/23  6:50 PM    Specimen: Stool   Culture Result Status    C. DIFFICILE TOXIN GENE, PCR Negative Final    PRESUMPTIVE 027/NAP1/BI Negative Final   GI PANEL BY BIOFIRE FILM ARRAY    Collection Time: 06/30/23  6:50 PM    Specimen: Stool   Culture Result Status    ADENOVIRUS F 40/41 Target Not Detected Final    ASTROVIRUS Target Not Detected Final    CYCLOSPORA CAYETANENSIS Target Not Detected Final    CAMPYLOBACTER Target Not Detected Final    CRYPTOSPORIDIUM Target Not Detected Final    ENTAMOEBA HISTOLYTICA Target Not Detected Final    ENTEROAGGREGATIVE E. COLI (EAEC) Target Not Detected Final    ENTEROPATHOGENIC E COLI (EPEC) Target Not Detected Final    ENTEROTOXIGENIC E COLI (ETEC) LT/ST Target Not Detected Final    GIARDIA LAMBLIA Target Not Detected Final    NOROVIRUS GI/GII Target Not Detected Final    PLESIOMONAS SHIGELLOIDES Target Not Detected Final    ROTAVIRUS A Target Not Detected Final    SALMONELLA SPECIES Target Not Detected Final    SAPOVIRUS Target Not Detected Final    SHIGELLA/ENTEROINVASIVE E COLI (EIEC) Target Not Detected Final    SHIGA-LIKE TOXIN-PRODUCING E COLI (STEC) STX1/STX2 Target Not Detected Final    VIBRIO CHOLERAE Target Not Detected Final    VIBRIO Target Not Detected Final    YERSINIA ENTEROCOLITICA Target Not Detected Final    Narrative    The results of this test should not be used as the sole basis for diagnosis, treatment, or other management decisions.    Test developed and performance on this sample type validated at [facility] using an FDA cleared assay.  Performed by [employer] using the Biofire Gastrointestinal Panel.  Imaging:   CT ABDOMEN PELVIS W IV CONTRAST  Narrative: Tennis Must  Capaldi    RADIOLOGIST: Mickey Farber, MD    CT ABDOMEN PELVIS W IV CONTRAST performed on 06/30/2023 1:22 PM    CLINICAL HISTORY: K57.30: Diverticulosis of colon without diverticulitis.  LLQ ABD PAIN, DIARRHEA, H/O Diverticulosis of colon without diverticulitis, CHOLECYSTECTOMY, APPENDECTOMY    TECHNIQUE:  Abdomen and pelvis CT with intravenous contrast.  IV CONTRAST: 75 ml's of Omnipaque 350    COMPARISON:  04/06/2018    FINDINGS:  Lung bases: Clear. There is a small sliding hiatal hernia    Liver:   There is some fatty infiltration    Gallbladder:   Surgically absent.    Spleen:   Unremarkable.    Pancreas:   Unremarkable.    Adrenals:   Unremarkable.    Kidneys:   Unremarkable.    Bladder:  Decompressed    Uterus and Adnexa:  Unremarkable.    Bowel:   There is some liquid stool in the colon. There is diverticulosis of the distal descending and sigmoid colon with some inflammatory changes adjacent to the junction of the distal descending and proximal sigmoid colon  There is some mural thickening of the stomach and duodenal bulb as well as the proximal duodenum    Appendix:  Absent    Lymph nodes:  No suspicious lymph node enlargement.    Vasculature:   Major vascular structures are unremarkable.     Peritoneum / Retroperitoneum: No ascites.  No free air.    Bones:   Degenerative changes of the spine.  Impression: 1. Diverticulosis. There are inflammatory changes at the junction of the distal descending and proximal sigmoid colon that may represent mild diverticulitis. Epiploic appendagitis could have a similar appearance . There is no associated drainable abscess or pneumoperitoneum  2. Mural thickening of the stomach and duodenum may be postinflammatory, suggesting gastritis and duodenitis    One or more dose reduction techniques were used (e.g., Automated exposure control, adjustment of the mA and/or kV according to patient size, use of iterative reconstruction technique).    A Significant Orange actionable finding  has been sent via the PowerConnect Actionable Findings application on 06/30/2023 1:31 PM, Message ID [id number]. Receipt of this communication will be communicated to [place] RADIOLOGY STAFF or responsible provider and will be documented in PowerConnect Actionable Findings System upon receiving the acknowledgement.    Radiologist location ID: [id number]        Assessment/ Plan:   Active Hospital Problems   (*Primary Problem)    Diagnosis    *Diverticulitis    Hyperlipidemia    Gastroesophageal reflux disease without esophagitis    Irritable bowel syndrome with diarrhea    Fibromyalgia    Mild intermittent asthma without complication    PVC's (premature ventricular contractions)     This is a 68 year old female with a PMH of asthma, esophageal reflux, hypertension, HLD, history of PVCs, and irritable bowel syndrome who presented to the ER with complaints of abdominal pain, diarrhea and an abnormal CT scan.     Patient reported that she has been having diarrhea for the last 4 weeks associated with left lower quadrant abdominal pain.  She presented to her PCP on 01/22 and was prescribed p.o. Augmentin and CT abdomen/pelvis was ordered.    CT abdomen/pelvis showed findings suggestive of mild diverticulitis and her PCP asked her to present to the ER.     Patient reported a past history of diverticulitis about 10 years  ago treated with ciprofloxacin and Flagyl, which was eventually was complicated by C diff infection.        CT abdomen/pelvis:   1.Diverticulosis. There are inflammatory changes at the junction of the distal descending and proximal sigmoid colon that may represent mild diverticulitis. Epiploic appendagitis could have a similar appearance . There is no associated drainable abscess or pneumoperitoneum  2.Mural thickening of the stomach and duodenum may be postinflammatory, suggesting gastritis and duodenitis         Abdominal pain   Persistent diarrhea   Mild diverticulitis  -IV Zosyn   -C diff and stool  BioFire are negative  -GI consulted for gastric and duodenal thickening.  Recommended outpatient EGD/colonoscopy.  Supportive care at this time  -advance diet as tolerated  -p.r.n. Imodium  -Monitor hemodynamics closely              Comorbidities   hypertension   HLD   Esophageal reflux   History of PVCs  Asthma      -home meds as ordered      Nutrition:    DIET BLAND Do you want to initiate MNT Protocol? Yes    Additional clinical characteristics related to nutrition:    - monitor for weight changes   - monitor intake and output    - monitor bowel functions                Disposition Planning:  Home    Delight Ovens, MD  07/02/2023  [facility] MEDICINE HOSPITALIST      This note was partially created using voice recognition software and is inherently subject to errors including those of syntax and "sound alike " substitutions which may escape proof reading. In such instances, original meaning may be extrapolated by contextual derivation.

## 2023-07-02 NOTE — Discharge Summary (Incomplete)
[facility]  DISCHARGE SUMMARY    PATIENT NAME:  Alexandra Coleman, Alexandra Coleman  MRN:  [id number]  DOB:  04-12-1956    ENCOUNTER DATE:  06/30/2023  INPATIENT ADMISSION DATE: 06/30/2023  DISCHARGE DATE:  07/02/2023    ATTENDING PHYSICIAN: Delight Ovens, MD  SERVICE: PRN HOSPITALIST 2  PRIMARY CARE PHYSICIAN: No Pcp       No lay caregiver identified.    PRIMARY DISCHARGE DIAGNOSIS: Diverticulitis  Active Hospital Problems    Diagnosis Date Noted    Principal Problem: Diverticulitis [K57.92] 12/24/2021    Hyperlipidemia [E78.5] 05/05/2022    Gastroesophageal reflux disease without esophagitis [K21.9] 12/24/2021    Irritable bowel syndrome with diarrhea [K58.0] 12/24/2021    Fibromyalgia [M79.7] 12/24/2021    Mild intermittent asthma without complication [J45.20] 12/24/2021    PVC's (premature ventricular contractions) [I49.3] 12/24/2021      Resolved Hospital Problems   No resolved problems to display.     Active Non-Hospital Problems    Diagnosis Date Noted    Osteoarthritis of right knee, unspecified osteoarthritis type 12/24/2021    Osteoporosis 12/24/2021    H/O degenerative disc disease 12/24/2021    Peripheral edema 12/24/2021             Current Discharge Medication List        START taking these medications.        Details   loperamide 2 mg Capsule  Commonly known as: IMODIUM   2 mg, Oral, EVERY 6 HOURS PRN  Qty: 20 Capsule  Refills: 0            CONTINUE these medications which have CHANGED during your visit.        Details   amoxicillin-pot clavulanate 875-125 mg Tablet  Commonly known as: AUGMENTIN  What changed: when to take this   1 Tablet, Oral, 2 TIMES DAILY  Qty: 8 Tablet  Refills: 0            CONTINUE these medications - NO CHANGES were made during your visit.        Details   fluorouraciL 5 % Cream  Commonly known as: EFUDEX   APPLY TWO TIMES A DAY TO SKIN CANCER X 4 WEEKS. WASH OFF IN AM.  Refills: 0     pantoprazole 40 mg Tablet, Delayed Release (E.C.)  Commonly known as: PROTONIX   40 mg,  Oral, DAILY  Qty: 90 Tablet  Refills: 1     spironolactone 50 mg Tablet  Commonly known as: ALDACTONE   50 mg, Oral, EVERY MORNING  Qty: 90 Tablet  Refills: 1            STOP taking these medications.      metoprolol succinate 25 mg Tablet Sustained Release 24 hr  Commonly known as: TOPROL-XL            Discharge med list refreshed?  YES     Allergies   Allergen Reactions    Latex Rash    Ciprofloxacin     Codeine     Diphenhydramine Hcl     Levofloxacin     Meloxicam  Other Adverse Reaction (Add comment)     Rapid heart rate     HOSPITAL PROCEDURE(S):   No orders of the defined types were placed in this encounter.      REASON FOR HOSPITALIZATION AND HOSPITAL COURSE   BRIEF HPI:  This is a 68 y.o., female admitted for abdominal pain, diarrhea, mild diverticulitis    BRIEF  HOSPITAL NARRATIVE:       This is a 68 year old female with a PMH of asthma, esophageal reflux, hypertension, HLD, history of PVCs, and irritable bowel syndrome who presented to the ER with complaints of abdominal pain, diarrhea and an abnormal CT scan.     Patient reported that she has been having diarrhea for the last 4 weeks associated with left lower quadrant abdominal pain.  She presented to her PCP on 01/22 and was prescribed p.o. Augmentin and CT abdomen/pelvis was ordered.    CT abdomen/pelvis showed findings suggestive of mild diverticulitis and her PCP asked her to present to the ER.     Patient reported a past history of diverticulitis about 10 years ago treated with ciprofloxacin and Flagyl, which was eventually was complicated by C diff infection.        CT abdomen/pelvis:   1.Diverticulosis. There are inflammatory changes at the junction of the distal descending and proximal sigmoid colon that may represent mild diverticulitis. Epiploic appendagitis could have a similar appearance . There is no associated drainable abscess or pneumoperitoneum  2.Mural thickening of the stomach and duodenum may be postinflammatory, suggesting gastritis  and duodenitis       She was admitted.  C diff and stool BioFire were negative.      GI was on board.  Recommended supportive care and outpatient follow up for EGD/colonoscopy.      With C diff and stool BioFire negative, she was started on Imodium p.r.n..  IV Zosyn was continued.  She made remarkable improvement clinical status.    Patient was then discharged home with outpatient follow up with GI.    Metoprolol was discontinued due to bradycardia.      TRANSITION/POST DISCHARGE CARE/PENDING TESTS/REFERRALS: None    CONDITION ON DISCHARGE:  A. Ambulation: Full ambulation  B. Self-care Ability: Complete  C. Cognitive Status Alert and Oriented x 3  D. Code status at discharge:       LINES/DRAINS/WOUNDS AT DISCHARGE:   Patient Lines/Drains/Airways Status       Active Line / Dialysis Catheter / Dialysis Graft / Drain / Airway / Wound       Name Placement date Placement time Site Days    Peripheral IV Left Basilic  (medial side of arm) 07/01/23  2350  -- less than 1                    DISCHARGE DISPOSITION:  Home discharge  DISCHARGE INSTRUCTIONS:  Post-Discharge Follow Up Appointments       Monday Aug 01, 2023    Return Patient Visit with Amedeo Kinsman, APRN,FNP-BC at  9:00 AM      Cardiology [facility], [facility], [place]  [address]  [place] [address]  [phone]          No discharge procedures on file.       Delight Ovens, MD    Copies sent to Care Team         Relationship Specialty Notifications Start End    Pcp, No PCP - General   06/30/23             Referring providers can utilize [email or web address] to access their referred [facility] Medicine patient's information.

## 2023-07-04 ENCOUNTER — Telehealth (HOSPITAL_COMMUNITY): Payer: Self-pay

## 2023-07-04 NOTE — Telephone Encounter (Signed)
Transition of Care Contact Information  Discharge Date: 07/02/2023  Transition Facility Type--Hospital (Inpatient or Observation)  Facility Name--[facility]  Interactive Contact(s): Completed or attempted contact indicated by Date/Time  Completed Contact: 07/04/2023 11:04 AM  First Attempt Call: 07/04/2023  8:50 AM  Contact Method(s)-- Patient/Caregiver Telephone  Clinical Staff Name/Role who Marcelle Overlie, RN  Transition Assessment  Discharge Summary obtained?--Yes  How are you recovering?--Improving  Discharge Meds obtained?--Yes  Discharge medication changes reviewed?--Yes  Full Medication Reconciliation Completed?--No  Medication understanding --knows new medication(s)--knows purpose of medication  Medication Concerns?--No  Have everything needed for recovery?--Yes  Care Coordination:   Patient has transition follow-up appointment date and time?--Yes  Follow up appointment date:--07/05/2023  Specialist Transition Visit planned?--Yes  Specialist Transition Visit date:--08/01/2023  Patient/caregiver plans to attend transition visit?--Yes  Primary Follow-up Barrier--follow-up appointment needed  Interventions provided --reinforced discharge instructions--patient expresses understanding of follow-up plan  Home Health or DME ordered at discharge?--No  Clinician/Team notified?--No  Primary reason clinician notified?  Transition Note:Plan:      Patient requests transportation referral for follow up appointment? No  Instructions:     Reviewed After Visit [place] (AVS) and discharge instructions as outlined on AVS. Yes   Agrees to contact PCP for non-acute symptoms during the hours of 8 a.m. - 4 p.m., PCP number provided/reinforced. Yes   Nurse Navigator toll free number, resources available, and 24/7 hours of operation provided to patient and/or caregiver. Yes     Referrals to population health? No    Additional information/assessment:  No questions/concerns at this time.  Patient will contact  PCP to schedule a follow up appointment. NN discussed the importance of making follow up appointments with verbal understanding from the patient.  NN reviewed AVS with patient, including NN line information and phone number. Patient verbalizes understanding of instructions, information, education and has no further questions.      Huel Coventry, RN

## 2023-07-05 LAB — ADULT ROUTINE BLOOD CULTURE, SET OF 2 BOTTLES (BACTERIA AND YEAST)
BLOOD CULTURE, ROUTINE: NO GROWTH
BLOOD CULTURE, ROUTINE: NO GROWTH

## 2023-08-01 ENCOUNTER — Encounter (INDEPENDENT_AMBULATORY_CARE_PROVIDER_SITE_OTHER): Payer: Self-pay | Admitting: NURSE PRACTITIONER

## 2023-08-01 ENCOUNTER — Other Ambulatory Visit: Payer: Self-pay

## 2023-08-01 ENCOUNTER — Ambulatory Visit: Payer: Medicare PPO | Attending: NURSE PRACTITIONER | Admitting: NURSE PRACTITIONER

## 2023-08-01 VITALS — BP 158/90 | HR 67 | Ht 64.0 in | Wt 171.0 lb

## 2023-08-01 DIAGNOSIS — I4510 Unspecified right bundle-branch block: Secondary | ICD-10-CM

## 2023-08-01 DIAGNOSIS — R002 Palpitations: Secondary | ICD-10-CM | POA: Insufficient documentation

## 2023-08-01 DIAGNOSIS — R55 Syncope and collapse: Secondary | ICD-10-CM | POA: Insufficient documentation

## 2023-08-01 DIAGNOSIS — I493 Ventricular premature depolarization: Secondary | ICD-10-CM | POA: Insufficient documentation

## 2023-08-01 DIAGNOSIS — I10 Essential (primary) hypertension: Secondary | ICD-10-CM | POA: Insufficient documentation

## 2023-08-01 DIAGNOSIS — R9431 Abnormal electrocardiogram [ECG] [EKG]: Secondary | ICD-10-CM

## 2023-08-01 DIAGNOSIS — R42 Dizziness and giddiness: Secondary | ICD-10-CM | POA: Insufficient documentation

## 2023-08-01 LAB — ECG W INTERP (AMB USE ONLY)(MUSE,IN CLINIC)
Atrial Rate: 65 {beats}/min
Calculated P Axis: 55 degrees
Calculated R Axis: 51 degrees
Calculated T Axis: 48 degrees
PR Interval: 144 ms
QRS Duration: 122 ms
QT Interval: 418 ms
QTC Calculation: 434 ms
Ventricular rate: 65 {beats}/min

## 2023-08-01 MED ORDER — METOPROLOL SUCCINATE ER 25 MG TABLET,EXTENDED RELEASE 24 HR
12.5000 mg | ORAL_TABLET | Freq: Every day | ORAL | Status: DC
Start: 2023-08-01 — End: 2024-01-30

## 2023-08-01 NOTE — Progress Notes (Signed)
 [facility]     Alexandra, Chipman Coleman, 68 y.o. female  Date of Service: 08/01/2023  Date of Birth:  07-Mar-1956  PCP:  No Pcp  Chief Complaint   Patient presents with    Follow Up     Yearly     Hypertension     Hx of Palpitations and PVCs        HPI:    The patient has no history of CAD.  Heart catheterization in 2020 due to abnormal stress test showed normal coronary anatomy.  LV-gram showed preserved EF of 50-55%.  Echocardiogram in February 2020 showed mild-to-moderately reduced LV function with EF 48% with anteroseptal and lateral hypokinesis, grade 1 diastolic dysfunction.  Holter monitor in December 2019 showed frequent PVCs and a short 3 beat run of V-tach.  Patient did see Dr. May who started her on flecainide.  Was caused her headache so she was changed to metoprolol and symptoms have been well controlled.     08/02/22 The patient is here for routine f/u, her last visit was in December 2021.  She has been having some issues with dizziness, mainly in the mornings.  She states she will stand up and starts losing her balance and feels like the room is spinning.  She will then lay back down in the wearing this still spinning.  She has had 3 episodes in the last several weeks.  She has not been diagnosed with vertigo.  She also reports that she gets very chilled in the evenings and her teeth chattering for several hours and then she will become very hot like hot flashes, but she has not had these in years.  She has not seen an OBGYN in awhile.    08/01/2023 The patient is here for yearly follow up hypertension and palpitations.  She was recently hospitalized for diverticulitis dehydrated at the time her Toprol was held and never restarted.  She continues to have palpitations worse at night.  She also complains of dizziness and near syncopal episodes, not associated with the palpitations.  She denies chest pain.  Her blood pressure elevated today however she checks regularly home and it  has been controlled.    EKG: SR 65bpm, rbbb, septal infarct, age undetermined  LAB: 06/2023 Mag 1.8,   K+ 3.5, Cr 0.83, Hgb 12.6, Plt 232, TSH 2.967    Past Medical History:   Diagnosis Date    Asthma     Dyspnea     Edema     Esophageal reflux     History of skin cancer     Hypertension     IBS (irritable bowel syndrome)     Palpitations     PVC (premature ventricular contraction)        Past Surgical History:   Procedure Laterality Date    BASAL CELL CARCINOMA EXCISION      CARDIAC CATHETERIZATION  07/2018    [facility]  [place]    HX APPENDECTOMY      HX CHOLECYSTECTOMY      HX COLONOSCOPY         Current Outpatient Medications   Medication Sig    loperamide (IMODIUM) 2 mg Oral Capsule Take 1 Capsule (2 mg total) by mouth Every 6 hours as needed    metoprolol succinate (TOPROL-XL) 25 mg Oral Tablet Sustained Release 24 hr Take 0.5 Tablets (12.5 mg total) by mouth Once a day    pantoprazole (PROTONIX) 40 mg Oral Tablet, Delayed Release (E.C.) TAKE 1  TABLET BY MOUTH ONCE A DAY    spironolactone (ALDACTONE) 50 mg Oral Tablet TAKE 1 TABLET (50 MG TOTAL) BY MOUTH EVERY MORNING BEFORE BREAKFAST     ROS: Other than issues noted in HPI, all other systems were negative.     Exam:  Vitals:    08/01/23 0853   BP: (!) 158/90   Pulse: 67   SpO2: 96%   Weight: 77.6 kg (171 lb)   Height: 1.626 Coleman (5\' 4" )   BMI: 29.35       General: No acute distress and appears stated age.    Neck: No JVD, no carotid bruit. and supple, symmetrical, trachea midline.   Lungs: Clear to auscultation bilaterally.    Cardiovascular: Regular rate and rhythm, normal S1 S2, no murmur, no rub, or gallop, no thrill     Abdomen: Soft, non-tender and bowel sounds normal.    Extremities: Extremities normal, atraumatic, no cyanosis or edema.    Skin: Skin warm and dry.    Neurologic: Alert and oriented x3.    Orders placed this visit:  Orders Placed This Encounter    EKG (In-Clinic Today)    CAROTID ARTERY DUPLEX    metoprolol succinate (TOPROL-XL) 25 mg Oral  Tablet Sustained Release 24 hr       Assessment/Plan:  PVC's (premature ventricular contractions)    Essential hypertension    Palpitations    Postural dizziness with near syncope    Resume Toprol 12.5 mg once daily  Continue Aldactone 50 mg daily  Carotid ultrasound  Return to clinic 1 year    Jasiel Belisle, FNP-C 08/01/2023 09:29

## 2023-09-01 ENCOUNTER — Encounter (HOSPITAL_COMMUNITY): Payer: Self-pay | Admitting: NURSE PRACTITIONER

## 2023-09-07 ENCOUNTER — Ambulatory Visit
Admission: RE | Admit: 2023-09-07 | Discharge: 2023-09-07 | Disposition: A | Discharge status: Home / Self Care | Payer: Self-pay | Source: Ambulatory Visit | Attending: NURSE PRACTITIONER | Admitting: NURSE PRACTITIONER

## 2023-09-07 ENCOUNTER — Other Ambulatory Visit: Payer: Self-pay

## 2023-09-07 DIAGNOSIS — R42 Dizziness and giddiness: Secondary | ICD-10-CM | POA: Insufficient documentation

## 2023-09-07 DIAGNOSIS — R55 Syncope and collapse: Secondary | ICD-10-CM | POA: Insufficient documentation

## 2023-09-13 ENCOUNTER — Other Ambulatory Visit (HOSPITAL_COMMUNITY): Payer: Self-pay | Admitting: NURSE PRACTITIONER

## 2023-09-13 DIAGNOSIS — I4891 Unspecified atrial fibrillation: Secondary | ICD-10-CM

## 2023-09-22 ENCOUNTER — Other Ambulatory Visit: Payer: Self-pay

## 2023-09-22 ENCOUNTER — Ambulatory Visit
Admission: RE | Admit: 2023-09-22 | Discharge: 2023-09-22 | Disposition: A | Discharge status: Home / Self Care | Source: Ambulatory Visit | Attending: NURSE PRACTITIONER | Admitting: NURSE PRACTITIONER

## 2023-09-22 DIAGNOSIS — I4891 Unspecified atrial fibrillation: Secondary | ICD-10-CM | POA: Insufficient documentation

## 2023-09-22 LAB — TRANSTHORACIC ECHOCARDIOGRAM - ADULT
EF VISUAL ESTIMATE: 40
EF: 45

## 2023-09-22 MED ORDER — SODIUM CHLORIDE 0.9 % INJECTION SOLUTION
2.0000 mL | Freq: Once | INTRAVENOUS | Status: DC | PRN
Start: 2023-09-22 — End: 2023-09-23
  Administered 2023-09-22: 2 mL via INTRAVENOUS

## 2023-09-30 ENCOUNTER — Telehealth (INDEPENDENT_AMBULATORY_CARE_PROVIDER_SITE_OTHER): Payer: Self-pay | Admitting: NURSE PRACTITIONER

## 2023-11-03 ENCOUNTER — Other Ambulatory Visit (HOSPITAL_COMMUNITY): Payer: Self-pay | Admitting: NURSE PRACTITIONER

## 2023-11-03 DIAGNOSIS — R42 Dizziness and giddiness: Secondary | ICD-10-CM

## 2023-11-16 ENCOUNTER — Ambulatory Visit
Admission: RE | Admit: 2023-11-16 | Discharge: 2023-11-16 | Disposition: A | Discharge status: Home / Self Care | Payer: Self-pay | Source: Ambulatory Visit | Attending: NURSE PRACTITIONER | Admitting: NURSE PRACTITIONER

## 2023-11-16 ENCOUNTER — Other Ambulatory Visit: Payer: Self-pay

## 2023-11-16 DIAGNOSIS — R42 Dizziness and giddiness: Secondary | ICD-10-CM | POA: Insufficient documentation

## 2023-12-08 ENCOUNTER — Ambulatory Visit
Admission: RE | Admit: 2023-12-08 | Discharge: 2023-12-08 | Disposition: A | Discharge status: Home / Self Care | Source: Ambulatory Visit | Attending: NURSE PRACTITIONER | Admitting: NURSE PRACTITIONER

## 2023-12-08 ENCOUNTER — Other Ambulatory Visit (HOSPITAL_COMMUNITY): Payer: Self-pay | Admitting: NURSE PRACTITIONER

## 2023-12-08 ENCOUNTER — Other Ambulatory Visit: Payer: Self-pay

## 2023-12-08 DIAGNOSIS — R053 Chronic cough: Secondary | ICD-10-CM

## 2023-12-11 DIAGNOSIS — I454 Nonspecific intraventricular block: Secondary | ICD-10-CM

## 2023-12-11 DIAGNOSIS — I471 Supraventricular tachycardia, unspecified: Secondary | ICD-10-CM

## 2023-12-11 DIAGNOSIS — I472 Ventricular tachycardia, unspecified: Secondary | ICD-10-CM

## 2023-12-11 LAB — 14 DAY EXTENDED HOLTER MONITOR
Heart rate (average): 82 {beats}/min
Isolated SVE count: 109 episodes
Isolated VE Counts: 1273 episodes
Longest supraventricular tachycardia episode - duration: 9.6 s
Longest supraventricular tachycardia episode - heart rate (: 126 {beats}/min
Longest supraventricular tachycardia episode - number of be: 20 beats
Longest ventricular tachycardia episode - duration: 2.6
Longest ventricular tachycardia episode - heart rate (avera: 183
Longest ventricular tachycardia episode - number of beats: 8 beats
SVE Couplets Counts: 10 episodes
Supraventricular tachycardia - heart rate (average): 126 {beats}/min
Supraventricular tachycardia - number of episodes: 1
Supraventricular tachycardia with fastest heart rate - dura: 9.6 s
Supraventricular tachycardia with fastest heart rate - hear: 126 {beats}/min
Supraventricular tachycardia with fastest heart rate - numb: 20 beats
VE Couplets Counts: 30 episodes
Ventricular tachycardia - heart rate (average): 183 {beats}/min
Ventricular tachycardia - number of episodes: 1
Ventricular tachycardia with fastest heart rate - duration: 2.6 s
Ventricular tachycardia with fastest heart rate - heart rat: 183
Ventricular tachycardia with fastest heart rate - number of: 8 beats

## 2024-01-27 ENCOUNTER — Other Ambulatory Visit (INDEPENDENT_AMBULATORY_CARE_PROVIDER_SITE_OTHER): Payer: Self-pay | Admitting: NURSE PRACTITIONER

## 2024-03-29 ENCOUNTER — Ambulatory Visit (INDEPENDENT_AMBULATORY_CARE_PROVIDER_SITE_OTHER): Payer: Self-pay | Admitting: NEUROLOGY

## 2024-08-06 ENCOUNTER — Ambulatory Visit (INDEPENDENT_AMBULATORY_CARE_PROVIDER_SITE_OTHER): Payer: Self-pay | Admitting: NURSE PRACTITIONER
